# Patient Record
Sex: Female | Born: 1965 | Race: White | Hispanic: No | Marital: Married | Smoking: Current every day smoker
Health system: Southern US, Community
[De-identification: ages and names within clinical notes are randomized; demographics above are authoritative.]

## PROBLEM LIST (undated history)

## (undated) DIAGNOSIS — F1010 Alcohol abuse, uncomplicated: Secondary | ICD-10-CM

## (undated) DIAGNOSIS — I10 Essential (primary) hypertension: Secondary | ICD-10-CM

## (undated) DIAGNOSIS — F419 Anxiety disorder, unspecified: Secondary | ICD-10-CM

## (undated) DIAGNOSIS — Z720 Tobacco use: Secondary | ICD-10-CM

## (undated) HISTORY — PX: APPENDECTOMY: SHX54

---

## 1998-06-02 ENCOUNTER — Emergency Department (HOSPITAL_COMMUNITY): Admission: EM | Admit: 1998-06-02 | Discharge: 1998-06-02 | Payer: Self-pay | Admitting: Emergency Medicine

## 1999-01-28 ENCOUNTER — Emergency Department (HOSPITAL_COMMUNITY): Admission: EM | Admit: 1999-01-28 | Discharge: 1999-01-29 | Payer: Self-pay | Admitting: Emergency Medicine

## 1999-07-05 ENCOUNTER — Emergency Department (HOSPITAL_COMMUNITY): Admission: EM | Admit: 1999-07-05 | Discharge: 1999-07-05 | Payer: Self-pay

## 2000-09-05 ENCOUNTER — Encounter: Admission: RE | Admit: 2000-09-05 | Discharge: 2000-09-05 | Payer: Self-pay | Admitting: Obstetrics and Gynecology

## 2000-09-05 ENCOUNTER — Encounter: Payer: Self-pay | Admitting: Obstetrics and Gynecology

## 2001-03-25 ENCOUNTER — Emergency Department (HOSPITAL_COMMUNITY): Admission: EM | Admit: 2001-03-25 | Discharge: 2001-03-26 | Payer: Self-pay

## 2001-04-06 ENCOUNTER — Other Ambulatory Visit: Admission: RE | Admit: 2001-04-06 | Discharge: 2001-04-06 | Payer: Self-pay | Admitting: Obstetrics and Gynecology

## 2002-07-09 ENCOUNTER — Encounter: Payer: Self-pay | Admitting: Emergency Medicine

## 2002-07-09 ENCOUNTER — Emergency Department (HOSPITAL_COMMUNITY): Admission: EM | Admit: 2002-07-09 | Discharge: 2002-07-09 | Payer: Self-pay | Admitting: Emergency Medicine

## 2004-01-16 ENCOUNTER — Other Ambulatory Visit: Admission: RE | Admit: 2004-01-16 | Discharge: 2004-01-16 | Payer: Self-pay | Admitting: Obstetrics and Gynecology

## 2004-12-16 ENCOUNTER — Emergency Department (HOSPITAL_COMMUNITY): Admission: EM | Admit: 2004-12-16 | Discharge: 2004-12-16 | Payer: Self-pay | Admitting: Emergency Medicine

## 2005-07-17 ENCOUNTER — Other Ambulatory Visit: Admission: RE | Admit: 2005-07-17 | Discharge: 2005-07-17 | Payer: Self-pay | Admitting: Obstetrics and Gynecology

## 2005-08-14 ENCOUNTER — Encounter (INDEPENDENT_AMBULATORY_CARE_PROVIDER_SITE_OTHER): Payer: Self-pay | Admitting: *Deleted

## 2005-08-14 ENCOUNTER — Inpatient Hospital Stay (HOSPITAL_COMMUNITY): Admission: EM | Admit: 2005-08-14 | Discharge: 2005-08-14 | Payer: Self-pay | Admitting: Family Medicine

## 2007-02-02 ENCOUNTER — Other Ambulatory Visit: Admission: RE | Admit: 2007-02-02 | Discharge: 2007-02-02 | Payer: Self-pay | Admitting: Obstetrics and Gynecology

## 2009-04-15 ENCOUNTER — Emergency Department (HOSPITAL_COMMUNITY): Admission: EM | Admit: 2009-04-15 | Discharge: 2009-04-15 | Payer: Self-pay | Admitting: Emergency Medicine

## 2010-07-30 LAB — POCT CARDIAC MARKERS
CKMB, poc: <1 ng/mL — ABNORMAL LOW (ref 1.0–8.0)
Myoglobin, poc: 46.3 ng/mL (ref 12–200)
Troponin i, poc: <0.05 ng/mL (ref 0.00–0.09)

## 2010-09-14 NOTE — Op Note (Signed)
[name], [name]                ACCOUNT NO.:  [id number]   MEDICAL RECORD NO.:  [id number]           PATIENT TYPE:   LOCATION:                                 FACILITY:   PHYSICIAN:  Sharlet Salina T. Hoxworth, M.D.  DATE OF BIRTH:   DATE OF PROCEDURE:  08/14/2005  DATE OF DISCHARGE:                                 OPERATIVE REPORT   PRE-AND-POSTOPERATIVE DIAGNOSIS:  Acute appendicitis.   PROCEDURE:  Laparoscopic appendectomy.   SURGEON:  Lorne Skeens. Hoxworth, M.D.   ANESTHESIA:  General.   BRIEF HISTORY:  [name] is a 45 year old female who presents with  periumbilical and right-sided abdominal pain.  She had workup including a CT  scan showing evidence of acute appendicitis.  I have recommended proceeding  with a laparoscopic and possible open appendectomy.  The nature of the  procedure, indications, risks of bleeding, infection, and possible need for  open procedure were discussed and understood.  She is now brought to the  operating room for this procedure.   DESCRIPTION OF PROCEDURE:  The patient was brought to the operating room and  placed in the supine position on the operating table and general  endotracheal anesthesia was induced.  She had received broad spectrum  preoperative antibiotics.  The abdomen was widely sterilely prepped and  draped.  Local anesthesia was used to infiltrate the trocar sites.   A 1-cm incision was made at the umbilicus; and dissection was carried down  to the midline fascia which was sharply incised for 1 cm; and the peritoneum  entered under direct vision.  Through a mattress suture of #0 Vicryl the  Hasson trocar was placed and pneumoperitoneum established.  Under direct  vision a 5-mm trocar was placed in the right upper quadrant; and a 12-mm  trocar in the left lower quadrant.  The appendix was exposed and was acutely  inflamed without perforation or gangrene, lying just inferior to the cecum.  The appendix was elevated.  Peritoneal  attachments were divided laterally,  mobilizing the appendix into the cecum.  The mesoappendix was then  sequentially divided with the harmonic scalpel, completely freeing the  appendix down to its base which was not inflamed.  This was then divided  with a single firing of the Endo GIA 45-mm blue load stapler.  The operative  site was thoroughly irrigated, and inspected for hemostasis which appeared  complete.   The appendix was placed in the EndoCatch bag and brought out through the  umbilicus.  Trocars were removed under direct vision.  All CO2 evacuated.  The mattress suture was secured at the umbilical incision.  Skin incisions  were closed with interrupted subcuticular 5-0 Vicryl and Steri-Strips.  Sponge, needle, and instrument counts were correct.  The patient taken  recovery in good condition.      Lorne Skeens. Hoxworth, M.D.  Electronically Signed     BTH/MEDQ  D:  08/14/2005  T:  08/14/2005  Job:  [id number]

## 2010-09-14 NOTE — H&P (Signed)
NAMEBREYON, BLASS NO.:  [id number]   MEDICAL RECORD NO.:  [id number]           PATIENT TYPE:   LOCATION:                                 FACILITY:   PHYSICIAN:  Sharlet Salina T. Hoxworth, M.D.  DATE OF BIRTH:   DATE OF ADMISSION:  08/14/2005  DATE OF DISCHARGE:                                HISTORY & PHYSICAL   CHIEF COMPLAINT:  Abdominal pain.   HISTORY OF PRESENT ILLNESS:  Megan [name] is a generally healthy 45 year old  female who presents with approximately 12 hours of persistent abdominal  pain.  She describes a sharp, somewhat intermittent periumbilical pain that  is gradually worsening.  She states she has had some mild or similar pain on  a couple of occasions over the last few months that resolved.  She denies  any nausea, vomiting, fever or chills.  No GU symptoms.   PAST MEDICAL HISTORY:  She has been treated for gastritis.  No surgery.  No  other serious illness or hospitalizations.   MEDICATIONS:  1.  Prilosec daily.  2.  BC powders occasionally.   ALLERGIES:  She states that NARCOTICS and SULFA DRUGS make her nauseated.   SOCIAL HISTORY:  Positive for cigarettes and alcohol.   FAMILY HISTORY:  Noncontributory.   REVIEW OF SYSTEMS:  GENERAL:  No fever, chills, weight change.  RESPIRATORY:  No shortness of breath or cough, history of pulmonary disease.  CARDIAC:  No  chest pain, palpitations, history of heart disease.  ABDOMEN:  GI as above.  GU:  As above.   PHYSICAL EXAMINATION:  VITAL SIGNS:  Temperature is 97.9, pulse 77,  respirations 18, blood pressure 157/107.  GENERAL:  A mildly overweight white female in no acute distress.  SKIN:  Warm and dry.  No rash or infection.  HEENT:  No palpable mass or hepatomegaly.  Sclerae nonicteric.  Oropharynx  clear.  LYMPH NODES:  No cervical, supraclavicular or inguinal nodes palpable.  LUNGS:  Clear without wheezing or increased work of breathing.  CARDIAC:  Regular rate and rhythm.  No  murmurs.  No edema.  ABDOMEN:  Decreased bowel sounds.  There is well localized right mid  abdominal and right lower quadrant tenderness with guarding.  No palpable  masses or hepatosplenomegaly.  EXTREMITIES:  No joint swelling or deformity.  NEUROLOGIC:  Alert and oriented.  Sensory exam is grossly normal.   LABORATORY DATA:  Urinalysis negative.  Pregnancy test negative.  White  count is elevated at 1700, hemoglobin 14.1.  Electrolytes normal.   I reviewed her CT scan, which shows evidence of acute appendicitis without  perforation or abscess.   ASSESSMENT AND PLAN:  Apparent acute appendicitis.  The patient is being  treated preoperatively with broad spectrum antibiotics, and will be taken to  the operating room for laparoscopic appendectomy.      Lorne Skeens. Hoxworth, M.D.  Electronically Signed     BTH/MEDQ  D:  08/14/2005  T:  08/14/2005  Job:  [id number]

## 2014-10-20 ENCOUNTER — Encounter (HOSPITAL_COMMUNITY): Payer: Self-pay | Admitting: Emergency Medicine

## 2014-10-20 ENCOUNTER — Emergency Department (HOSPITAL_COMMUNITY)
Admission: EM | Admit: 2014-10-20 | Discharge: 2014-10-20 | Disposition: A | Discharge status: Home / Self Care | Payer: Self-pay | Attending: Emergency Medicine | Admitting: Emergency Medicine

## 2014-10-20 DIAGNOSIS — R030 Elevated blood-pressure reading, without diagnosis of hypertension: Secondary | ICD-10-CM

## 2014-10-20 DIAGNOSIS — Z880 Allergy status to penicillin: Secondary | ICD-10-CM | POA: Insufficient documentation

## 2014-10-20 DIAGNOSIS — I10 Essential (primary) hypertension: Secondary | ICD-10-CM | POA: Insufficient documentation

## 2014-10-20 DIAGNOSIS — Z720 Tobacco use: Secondary | ICD-10-CM | POA: Insufficient documentation

## 2014-10-20 DIAGNOSIS — Z79899 Other long term (current) drug therapy: Secondary | ICD-10-CM | POA: Insufficient documentation

## 2014-10-20 DIAGNOSIS — IMO0001 Reserved for inherently not codable concepts without codable children: Secondary | ICD-10-CM

## 2014-10-20 DIAGNOSIS — Z760 Encounter for issue of repeat prescription: Secondary | ICD-10-CM

## 2014-10-20 HISTORY — DX: Essential (primary) hypertension: I10

## 2014-10-20 MED ORDER — AMLODIPINE BESYLATE 5 MG PO TABS: 5.0000 mg | ORAL_TABLET | Freq: Every day | ORAL | Status: DC

## 2014-10-20 NOTE — ED Provider Notes (Signed)
CSN: [id number]     Arrival date & time 10/20/14  1739 History   This chart was scribed for Megan Glad, PA-C working with Pricilla Loveless, MD by Elveria Rising, ED Scribe. This patient was seen in room WTR7/WTR7 and the patient's care was started at 6:26 PM.   Chief Complaint  Patient presents with  . Medication Refill   The history is provided by the patient. No language interpreter was used.   HPI Comments: Megan [name] is a 49 y.o. female with PMHx of Hypertension who presents to the Emergency Department requesting medication refill of her Amlodipine  which she exhausted 3 weeks ago. Patient shares that she attempted to update her prescription through her PCP, but their office is now under [facility] and they will not see her without $200 deposit/completing new patient protocols. Patient states that today she began experiencing mild headache, which has resolved at this time.  Patient states that her HA was consistent with previous hypertensive episodes. Patient denies chest pain, vision changes, focal weakness, numbness, tingling, shortness of breath, or urinary symptoms.    Past Medical History  Diagnosis Date  . Hypertension    Past Surgical History  Procedure Laterality Date  . Appendectomy     History reviewed. No pertinent family history. History  Substance Use Topics  . Smoking status: Current Every Day Smoker  . Smokeless tobacco: Not on file  . Alcohol Use: No   OB History    No data available     Review of Systems  Respiratory: Negative for shortness of breath.   Cardiovascular: Negative for chest pain.  Neurological: Positive for headaches.   Allergies  Amoxicillin; Macrobid; and Sulfa antibiotics  Home Medications   Prior to Admission medications   Medication Sig Start Date End Date Taking? Authorizing Provider  amLODipine (NORVASC) 5 MG tablet Take 5 mg by mouth daily.   Yes Historical Provider, MD  Aspirin-Salicylamide-Caffeine (BC HEADACHE POWDER  PO) Take 1 packet by mouth daily as needed (headache).   Yes Historical Provider, MD  omeprazole (PRILOSEC) 20 MG capsule Take 20 mg by mouth daily.   Yes Historical Provider, MD   Triage Vitals: BP 182/103 mmHg  Pulse 82  Temp(Src) 98.4 F (36.9 C) (Oral)  Resp 18  SpO2 98% Physical Exam  Constitutional: She is oriented to person, place, and time. She appears well-developed and well-nourished. No distress.  HENT:  Head: Normocephalic and atraumatic.  Eyes: EOM are normal. Pupils are equal, round, and reactive to light.  Neck: Normal range of motion. Neck supple. No tracheal deviation present.  Cardiovascular: Normal rate, regular rhythm, normal heart sounds and intact distal pulses.   Pulmonary/Chest: Effort normal and breath sounds normal. No respiratory distress.  Musculoskeletal: Normal range of motion.  Neurological: She is alert and oriented to person, place, and time. She has normal strength. No cranial nerve deficit or sensory deficit. Gait normal.  Skin: Skin is warm and dry.  Psychiatric: She has a normal mood and affect. Her behavior is normal.  Nursing note and vitals reviewed.   ED Course  Procedures (including critical care time)  COORDINATION OF CARE: 6:32 PM- Discussed treatment plan with patient at bedside and patient agreed to plan.   Labs Review Labs Reviewed - No data to display  Imaging Review No results found.   EKG Interpretation None      MDM   Final diagnoses:  None   Patient presents today requesting medication refill of her Amlodipine that she has  been out of for 3 weeks.  Patient is asymptomatic this time.  No signs or symptoms of Hypertensive Emergency.  Normal neurological exam.  Patient given refill of her Amlodipine and instructed to follow up with PCP.     Megan Glad, PA-C 10/20/14 2006  Pricilla Loveless, MD 10/25/14 [phone]

## 2014-10-20 NOTE — ED Notes (Signed)
Pt states that she needs refill of amlodipine.  States she tried to get it from her PCP.  States that her doctor is switching over to [employer] and won't see her without a 200 dollar deposit. Pt states that she knows her BP is elevated.  Pt states that she has been without it x 2-3 wks.

## 2014-10-20 NOTE — ED Notes (Signed)
Ambulatory with steady gait. AVS explained in detail. Educated about stopping smoking immediately. Advised to follow DASH diet and have BP re-checked.

## 2015-05-14 ENCOUNTER — Encounter (HOSPITAL_COMMUNITY): Payer: Self-pay | Admitting: Emergency Medicine

## 2015-05-14 ENCOUNTER — Emergency Department (HOSPITAL_COMMUNITY)
Admission: EM | Admit: 2015-05-14 | Discharge: 2015-05-14 | Disposition: A | Discharge status: Home / Self Care | Payer: Self-pay | Attending: Emergency Medicine | Admitting: Emergency Medicine

## 2015-05-14 DIAGNOSIS — Z79899 Other long term (current) drug therapy: Secondary | ICD-10-CM | POA: Insufficient documentation

## 2015-05-14 DIAGNOSIS — M10072 Idiopathic gout, left ankle and foot: Secondary | ICD-10-CM | POA: Insufficient documentation

## 2015-05-14 DIAGNOSIS — M109 Gout, unspecified: Secondary | ICD-10-CM

## 2015-05-14 DIAGNOSIS — I10 Essential (primary) hypertension: Secondary | ICD-10-CM | POA: Insufficient documentation

## 2015-05-14 DIAGNOSIS — F17200 Nicotine dependence, unspecified, uncomplicated: Secondary | ICD-10-CM | POA: Insufficient documentation

## 2015-05-14 DIAGNOSIS — Z880 Allergy status to penicillin: Secondary | ICD-10-CM | POA: Insufficient documentation

## 2015-05-14 DIAGNOSIS — M79672 Pain in left foot: Secondary | ICD-10-CM | POA: Insufficient documentation

## 2015-05-14 LAB — I-STAT CHEM 8, ED
BUN: 5 mg/dL — AB (ref 6–20)
CREATININE: 0.6 mg/dL (ref 0.44–1.00)
Calcium, Ion: 1.1 mmol/L — ABNORMAL LOW (ref 1.12–1.23)
Chloride: 101 mmol/L (ref 101–111)
GLUCOSE: 116 mg/dL — AB (ref 65–99)
HEMATOCRIT: 46 % (ref 36.0–46.0)
Hemoglobin: 15.6 g/dL — ABNORMAL HIGH (ref 12.0–15.0)
POTASSIUM: 3.4 mmol/L — AB (ref 3.5–5.1)
Sodium: 140 mmol/L (ref 135–145)
TCO2: 27 mmol/L (ref 0–100)

## 2015-05-14 MED ORDER — AMLODIPINE BESYLATE 5 MG PO TABS: 5.0000 mg | ORAL_TABLET | Freq: Every day | ORAL | Status: DC

## 2015-05-14 MED ORDER — INDOMETHACIN 25 MG PO CAPS: 25.0000 mg | ORAL_CAPSULE | Freq: Three times a day (TID) | ORAL | Status: DC | PRN

## 2015-05-14 MED ORDER — HYDROCODONE-ACETAMINOPHEN 5-325 MG PO TABS
1.0000 | ORAL_TABLET | Freq: Once | ORAL | Status: AC
  Administered 2015-05-14: 1 via ORAL
  Filled 2015-05-14: qty 1

## 2015-05-14 MED ORDER — AMLODIPINE BESYLATE 5 MG PO TABS
5.0000 mg | ORAL_TABLET | Freq: Once | ORAL | Status: AC
  Administered 2015-05-14: 5 mg via ORAL
  Filled 2015-05-14: qty 1

## 2015-05-14 MED ORDER — HYDROCODONE-ACETAMINOPHEN 5-325 MG PO TABS: 1.0000 | ORAL_TABLET | ORAL | Status: DC | PRN

## 2015-05-14 NOTE — Discharge Instructions (Signed)
Read the information below.  Use the prescribed medication as directed.  Please discuss all new medications with your pharmacist.  Do not take additional tylenol while taking the prescribed pain medication to avoid overdose.  You may return to the Emergency Department at any time for worsening condition or any new symptoms that concern you.    If you develop fevers, uncontrolled pain, weakness or numbness of the extremity, increased redness or swelling of the foot or leg, or you are unable to walk, return to the ER for a recheck.       Hypertension Hypertension is another name for high blood pressure. High blood pressure forces your heart to work harder to pump blood. A blood pressure reading has two numbers, which includes a higher number over a lower number (example: 110/72). HOME CARE   Have your blood pressure rechecked by your doctor.  Only take medicine as told by your doctor. Follow the directions carefully. The medicine does not work as well if you skip doses. Skipping doses also puts you at risk for problems.  Do not smoke.  Monitor your blood pressure at home as told by your doctor. GET HELP IF:  You think you are having a reaction to the medicine you are taking.  You have repeat headaches or feel dizzy.  You have puffiness (swelling) in your ankles.  You have trouble with your vision. GET HELP RIGHT AWAY IF:   You get a very bad headache and are confused.  You feel weak, numb, or faint.  You get chest or belly (abdominal) pain.  You throw up (vomit).  You cannot breathe very well. MAKE SURE YOU:   Understand these instructions.  Will watch your condition.  Will get help right away if you are not doing well or get worse.   This information is not intended to replace advice given to you by your health care provider. Make sure you discuss any questions you have with your health care provider.   Document Released: 10/02/2007 Document Revised: 04/20/2013 Document  Reviewed: 02/05/2013 Elsevier Interactive Patient Education 2016 Elsevier Inc.  Managing Your High Blood Pressure Blood pressure is a measurement of how forceful your blood is pressing against the walls of the arteries. Arteries are muscular tubes within the circulatory system. Blood pressure does not stay the same. Blood pressure rises when you are active, excited, or nervous; and it lowers during sleep and relaxation. If the numbers measuring your blood pressure stay above normal most of the time, you are at risk for health problems. High blood pressure (hypertension) is a long-term (chronic) condition in which blood pressure is elevated. A blood pressure reading is recorded as two numbers, such as 120 over 80 (or 120/80). The first, higher number is called the systolic pressure. It is a measure of the pressure in your arteries as the heart beats. The second, lower number is called the diastolic pressure. It is a measure of the pressure in your arteries as the heart relaxes between beats.  Keeping your blood pressure in a normal range is important to your overall health and prevention of health problems, such as heart disease and stroke. When your blood pressure is uncontrolled, your heart has to work harder than normal. High blood pressure is a very common condition in adults because blood pressure tends to rise with age. Men and women are equally likely to have hypertension but at different times in life. Before age 49, men are more likely to have hypertension. After 49  years of age, women are more likely to have it. Hypertension is especially common in African Americans. This condition often has no signs or symptoms. The cause of the condition is usually not known. Your caregiver can help you come up with a plan to keep your blood pressure in a normal, healthy range. BLOOD PRESSURE STAGES Blood pressure is classified into four stages: normal, prehypertension, stage 1, and stage 2. Your blood pressure  reading will be used to determine what type of treatment, if any, is necessary. Appropriate treatment options are tied to these four stages:  Normal  Systolic pressure (mm Hg): below 120.  Diastolic pressure (mm Hg): below 80. Prehypertension  Systolic pressure (mm Hg): 120 to 139.  Diastolic pressure (mm Hg): 80 to 89. Stage1  Systolic pressure (mm Hg): 140 to 159.  Diastolic pressure (mm Hg): 90 to 99. Stage2  Systolic pressure (mm Hg): 160 or above.  Diastolic pressure (mm Hg): 100 or above. RISKS RELATED TO HIGH BLOOD PRESSURE Managing your blood pressure is an important responsibility. Uncontrolled high blood pressure can lead to:  A heart attack.  A stroke.  A weakened blood vessel (aneurysm).  Heart failure.  Kidney damage.  Eye damage.  Metabolic syndrome.  Memory and concentration problems. HOW TO MANAGE YOUR BLOOD PRESSURE Blood pressure can be managed effectively with lifestyle changes and medicines (if needed). Your caregiver will help you come up with a plan to bring your blood pressure within a normal range. Your plan should include the following: Education  Read all information provided by your caregivers about how to control blood pressure.  Educate yourself on the latest guidelines and treatment recommendations. New research is always being done to further define the risks and treatments for high blood pressure. Lifestylechanges  Control your weight.  Avoid smoking.  Stay physically active.  Reduce the amount of salt in your diet.  Reduce stress.  Control any chronic conditions, such as high cholesterol or diabetes.  Reduce your alcohol intake. Medicines  Several medicines (antihypertensive medicines) are available, if needed, to bring blood pressure within a normal range. Communication  Review all the medicines you take with your caregiver because there may be side effects or interactions.  Talk with your caregiver about your  diet, exercise habits, and other lifestyle factors that may be contributing to high blood pressure.  See your caregiver regularly. Your caregiver can help you create and adjust your plan for managing high blood pressure. RECOMMENDATIONS FOR TREATMENT AND FOLLOW-UP  The following recommendations are based on current guidelines for managing high blood pressure in nonpregnant adults. Use these recommendations to identify the proper follow-up period or treatment option based on your blood pressure reading. You can discuss these options with your caregiver.  Systolic pressure of 120 to 139 or diastolic pressure of 80 to 89: Follow up with your caregiver as directed.  Systolic pressure of 140 to 160 or diastolic pressure of 90 to 100: Follow up with your caregiver within 2 months.  Systolic pressure above 160 or diastolic pressure above 100: Follow up with your caregiver within 1 month.  Systolic pressure above 180 or diastolic pressure above 110: Consider antihypertensive therapy; follow up with your caregiver within 1 week.  Systolic pressure above 200 or diastolic pressure above 120: Begin antihypertensive therapy; follow up with your caregiver within 1 week.   This information is not intended to replace advice given to you by your health care provider. Make sure you discuss any questions you have with  your health care provider.   Document Released: 01/08/2012 Document Reviewed: 01/08/2012 Elsevier Interactive Patient Education [employer]    Emergency Department Resource Guide 1) Find a Doctor and Pay Out of Pocket Although you won't have to find out who is covered by your insurance plan, it is a good idea to ask around and get recommendations. You will then need to call the office and see if the doctor you have chosen will accept you as a new patient and what types of options they offer for patients who are self-pay. Some doctors offer discounts or will set up payment plans for their  patients who do not have insurance, but you will need to ask so you aren't surprised when you get to your appointment.  2) Contact Your Local Health Department Not all health departments have doctors that can see patients for sick visits, but many do, so it is worth a call to see if yours does. If you don't know where your local health department is, you can check in your phone book. The CDC also has a tool to help you locate your state's health department, and many state websites also have listings of all of their local health departments.  3) Find a Walk-in Clinic If your illness is not likely to be very severe or complicated, you may want to try a walk in clinic. These are popping up all over the country in pharmacies, drugstores, and shopping centers. They're usually staffed by nurse practitioners or physician assistants that have been trained to treat common illnesses and complaints. They're usually fairly quick and inexpensive. However, if you have serious medical issues or chronic medical problems, these are probably not your best option.  No Primary Care Doctor: - Call Health Connect at  [phone] - they can help you locate a primary care doctor that  accepts your insurance, provides certain services, etc. - Physician Referral Service- [phone]  Chronic Pain Problems: Organization         Address  Phone   Notes  [facility]  [phone] Patients need to be referred by their primary care doctor.   Medication Assistance: Organization         Address  Phone   Notes  [facility] Medication [facility] [address] [place] [place], [place] [address] [phone] --Must be a resident of [facility] -- Must have NO insurance coverage whatsoever (no Medicaid/ Medicare, etc.) -- The pt. MUST have a primary care doctor that directs their care regularly and follows them in the community   MedAssist  [phone]   [employer]  [phone]     Agencies that provide inexpensive medical care: Organization         Address  Phone   Notes  [facility]  [phone]   [facility]    [phone]   [facility] [address] [place], [place] [address] [phone]   [facility] [address] [place] [place] [phone]   Planned Parenthood    [phone]   [facility]    [phone]   [facility]  [address], [place] Phone:  [phone], Fax:  [phone] Hours of Operation:  9 am - 6 pm, M-F.  Also accepts Medicaid/Medicare and self-pay.  [facility]  [address], [place] Phone: [phone], Fax: [phone]. Hours of Operation:  8:30 am - 5:30 pm, M-F.  Also accepts Medicaid and self-pay.  [facility] [place] [address] [place] Phone: [phone]   [facility] [address] Natasha Bence [place], [place] [phone], Ext. [phone] Mondays & Thursdays: 7-9 AM.  First 15 patients are seen on a first come, first serve basis.    Medicaid-accepting [facility] Providers:  Organization         Address  Phone   Notes  [facility] [address], [place] [phone] Also accepts self-pay patients.  [facility] [address] Laurell Josephs [place], [place]  [phone]   [facility] [address], [place] [phone]   [facility] [address] [place] [phone]   Renaye Rakers [address], [place]   [phone] Only accepts [place] Access [place] patients after they have their name applied to their card.   Self-Pay (no insurance) in [facility]:  Organization         Address  Phone   Notes  Sickle Cell Patients, [facility] [address] [place], [place] [phone]    [facility] [address] [place], [place] [phone]   [facility]  [address], [place] [phone]   [facility]/Dr. Osei-Bonsu  [address] [place] or [address], [place] [phone] Phone number for both [place] and [place] locations is the same.  [facility] [address], [place] [phone]   [facility] [address] [place] or [address] [phone]   [facility] [address] [place] [phone], phone; [phone], fax Sees patients 1st and 3rd Saturday of every month.  Must not qualify for public or private insurance (i.e. Medicaid, Medicare, [place] Health Choice, Veterans' Benefits)  Household income should be no more than 200% of the poverty level The clinic cannot treat you if you are pregnant or think you are pregnant  Sexually transmitted diseases are not treated at the clinic.    Dental Care: Organization         Address  Phone  Notes  [facility] Department of [facility] [address] [place], [place] [phone] Accepts children up to age 66 who are enrolled in [place] or [place] Health Choice; pregnant women with a Medicaid card; and children who have applied for Medicaid or [place] Health Choice, but were declined, whose parents can pay a reduced fee at time of service.  [facility] Department of [facility]  [address], [place] [phone] Accepts children up to age 86 who are enrolled in [place] or [place] Health Choice; pregnant women with a Medicaid card; and children who have applied for Medicaid or [place] Health Choice, but were declined, whose parents can pay a reduced fee at time of service.  [facility]  [address] [place], [place] [phone] Patients are seen by  appointment only. Walk-ins are not accepted. [facility] will see patients 59 years of age and older. Monday - Tuesday (8am-5pm) Most Wednesdays (8:30-5pm) $30 per visit, cash only  [facility] [employer] [facility]  [address], [employer] [phone])  [phone] Patients are seen by appointment only. Walk-ins are not accepted. [facility] will see patients 68 years of age and older. One Wednesday Evening (Monthly: Volunteer Based).  $30 per visit, cash only  [employer]  [phone] for adults; Children under age 59, call Graduate Pediatric Dentistry at [phone]. Children aged 89-14, please call [phone] to request a pediatric application.  Dental services are provided in all areas of dental care including fillings, crowns and bridges, complete and partial dentures, implants, gum treatment, root canals, and extractions. Preventive care is also provided. Treatment is provided to both adults and children. Patients are selected via a lottery and there is often a waiting list.   [facility] [address], [place]  [phone] [email or web address]   [facility] [address] [place], [place] [phone], Ext. [phone] Second and Fourth Thursday of each month, opens at 6:30 AM; Clinic ends at 9 AM.  Patients are seen on a first-come first-served basis, and a limited number are seen during each clinic.   [facility]  [address] Ether Griffins [place], [place] [phone]   Eligibility Requirements You must have lived in [place], [place], or [place] counties for at least the last three months.   You cannot be eligible for state or federal sponsored [place], including [employer], [place], or [employer].   You generally cannot be eligible for healthcare insurance through your employer.    How to apply: Eligibility screenings are held every Tuesday and Wednesday afternoon from 1:00 pm until 4:00 pm. You  do not need an appointment for the interview!  [facility] [address] [place], [place] [phone]   [facility] Health Department  [phone]   [facility] Health Department  [phone]   [facility] Health Department  [phone]    Behavioral Health Resources in the Community: Intensive Outpatient Programs Organization         Address  Phone  Notes  [facility] [address] [place], [place] [phone]   [facility] Outpatient [address] [place], [place] [phone]   [facility] [address] [place], [place]  [phone]   [facility] [address],  [place], [place] [phone] or [phone]   Substance Abuse Resources Organization         Address  Phone  Notes  [facility]  [phone]   [facility]  [phone]   [facility]  [phone]   Floydene Flock  [phone]   Residential & Outpatient Substance Abuse Program  [phone]   Psychological Services Organization         Address  Phone  Notes  [facility] Behavioral Health  [phone]   [facility] Services  [phone]   [facility] [address], [place] [phone] or [phone]    Mobile Crisis Teams Organization         Address  Phone  Notes  [facility], Mobile Crisis Care Unit  [phone]   [facility]  [address] [place], [place] [phone]   Doristine Locks [address] [place] [phone]    Self-Help/Support Groups Organization         Address  Phone             Notes  [facility] - variety of support groups  336- I7437963  Call for more information  Narcotics Anonymous (NA), [facility] [address], [employer] [place]  2 meetings at this location   Residential Sports administrator          Address  Phone  Notes  [facility] [address],    [place]  [phone]   [facility]  [address] [place] [address], [place], [place] [phone]   [facility] [address], [place] [phone] Admissions: 8am-3pm M-F  [facility] [address]    [place], [place] [phone]   [facility] [address] [place], [place], [place] [phone]   [facility] [address],  [place], [place] [phone]   [facility] - Intensive Outpatient [address]urell Joseph[address], [place], [place] [phone]   [facility] ([facility] [address] [place]  [place], [place] [phone] or [phone]   [facility] ([facility]) [address] [place], [place] [phone] Accepts Medicaid  [facility] [address],  [place] [phone] Substance Abuse/Addiction Treatment   [facility] Organization         Address  Phone  Notes  [facility]  [phone]   Angie Fava, PhD [address] Ervin Knack [place], [place]   [phone] or [phone]   [facility]   [address] [place], [place] [phone]   [facility] [address] [place], [place] [phone] Insurance/Medicaid/sponsorship through [facility] [address]                                    [place], [place] [phone] Therapy/tele-psych/case  [facility] [address], [place] [phone]    Dr. Lolly Mustache  [phone]   [facility]  [employer] [facility] Dept. 1) [address] [place] 2) [address], [place] 3)  [address], [place] [phone]  [phone]   [facility] Child Abuse Hotline [phone])[phone] or 4588559507 (After Hours)

## 2015-05-14 NOTE — ED Provider Notes (Signed)
History  By signing my name below, I, Karle PlumberJennifer Tensley, attest that this documentation has been prepared under the direction and in the presence of Niketa Turner, PA-C. Electronically Signed: Karle PlumberJennifer Tensley, ED Scribe. 05/14/2015. 5:42 PM.  Chief Complaint  Patient presents with  . Hypertension  . Foot Pain   The history is provided by the patient and medical records. No language interpreter was used.    HPI Comments:  Megan [name] is a 50 y.o. female who presents to the Emergency Department complaining of severe throbbing pain to her left great toe that started worsening yesterday. Pt rates the pain at 9/10. She reports having similar pain intermittently for the past several years but never this bad and assumed it was a bunion. She reports associated redness and warmth that began yesterday as well. Touching the area increases the pain. She denies alleviating factors. She denies chills, body aches, nausea, vomiting or diarrhea. She denies any trauma, injury or fall.  She states she has a wart that has been present for a very long time, on top of the area in which she has applied vinegar about 2 weeks ago (and intermittently) with no significant relief of the symptoms - this wart is unchanged.   She also complains of hypertension since being out of her Amlodipine for the past 7 months. She states she has been taking some she gets from a friend intermittently. There are no modifying factors reported. She denies SOB, HA, dizziness, nausea, light-headedness, CP or weakness.  Past Medical History  Diagnosis Date  . Hypertension    Past Surgical History  Procedure Laterality Date  . Appendectomy     No family history on file. Social History  Substance Use Topics  . Smoking status: Current Every Day Smoker  . Smokeless tobacco: None  . Alcohol Use: No   OB History    No data available     Review of Systems  Constitutional: Negative for fever and chills.  Respiratory: Negative for  shortness of breath.   Cardiovascular: Negative for chest pain.  Gastrointestinal: Negative for nausea, vomiting and abdominal pain.  Musculoskeletal: Positive for arthralgias. Negative for myalgias.  Skin: Positive for color change.  Allergic/Immunologic: Negative for immunocompromised state.  Neurological: Negative for dizziness, weakness, light-headedness and headaches.  Hematological: Does not bruise/bleed easily.  Psychiatric/Behavioral: Negative for self-injury.    Allergies  Amoxicillin; Macrobid; and Sulfa antibiotics  Home Medications   Prior to Admission medications   Medication Sig Start Date End Date Taking? Authorizing Provider  amLODipine (NORVASC) 5 MG tablet Take 1 tablet (5 mg total) by mouth daily. 05/14/15   Trixie DredgeEmily Dallie Patton, PA-C  Aspirin-Salicylamide-Caffeine (BC HEADACHE POWDER PO) Take 1 packet by mouth daily as needed (headache).    Historical Provider, MD  omeprazole (PRILOSEC) 20 MG capsule Take 20 mg by mouth daily.    Historical Provider, MD   Triage Vitals: BP 170/118 mmHg  Pulse 104  Temp(Src) 98.1 F (36.7 C) (Oral)  Resp 18  SpO2 100% Physical Exam  Constitutional: She appears well-developed and well-nourished. No distress.  HENT:  Head: Normocephalic and atraumatic.  Neck: Neck supple.  Cardiovascular: Normal rate and regular rhythm.   Left pedal pulses intact. Cap refill of left foot less than 3 seconds.  Pulmonary/Chest: Effort normal and breath sounds normal. No respiratory distress. She has no wheezes. She has no rales.  Abdominal: Soft. She exhibits no distension. There is no tenderness. There is no rebound and no guarding.  Musculoskeletal:  Left foot  first MTP with erythema, warmth and tenderness. Decreased ROM. No fluctuance. No induration. No calf edema or tenderness. No red streaking. Small, rough firm lesion c/w wart over dorsal foot near 1st MTP.    Neurological: She is alert.  Skin: She is not diaphoretic.  Nursing note and vitals  reviewed.   ED Course  Procedures (including critical care time) DIAGNOSTIC STUDIES: Oxygen Saturation is 100% on RA, normal by my interpretation.   COORDINATION OF CARE: 5:29 PM- Will prescribe Indomethacin. Return precautions discussed. Will refill Amlodipine after labs. Will order Vicodin and Amlodipine prior to discharge. Offered crutches but pt declined. Pt verbalizes understanding and agrees to plan.  Medications - No data to display  Labs Review Labs Reviewed  I-STAT CHEM 8, ED - Abnormal; Notable for the following:    Potassium 3.4 (*)    BUN 5 (*)    Glucose, Bld 116 (*)    Calcium, Ion 1.10 (*)    Hemoglobin 15.6 (*)    All other components within normal limits     MDM   Final diagnoses:  Essential hypertension  Acute gout of left foot, unspecified cause    Afebrile, nontoxic patient with left 1st MTP inflammation c/w gout.  She had no injury, there are no openings in the skin or no systemic symptoms that make me suspicious for septic joint. Pt given pain medication in ED as well as dose of her antihypertensive.   D/C home with PCP resources for follow up (advised very close follow up), norco, indomethacin, amlodipine.  Discussed result, findings, treatment, and follow up  with patient.  Pt given return precautions.  Pt verbalizes understanding and agrees with plan.        I personally performed the services described in this documentation, which was scribed in my presence. The recorded information has been reviewed and is accurate.     Trixie Dredge, PA-C 05/14/15 1911  Linwood Dibbles, MD 05/16/15 1000

## 2015-05-14 NOTE — ED Notes (Signed)
Pt given d/c instructions and Rx. Pt verbalized understanding.

## 2015-05-14 NOTE — ED Notes (Signed)
Pt states that she has been having redness and swelling to proximal joint of great toe since Friday.  States she thought it was her bunion causing her pain but it has since progressively become more red and swollen.  Pt also hypertensive and is requesting refill of amlodipine.

## 2016-05-19 ENCOUNTER — Encounter (HOSPITAL_COMMUNITY): Payer: Self-pay | Admitting: Emergency Medicine

## 2016-05-19 ENCOUNTER — Emergency Department (HOSPITAL_COMMUNITY)
Admission: EM | Admit: 2016-05-19 | Discharge: 2016-05-19 | Disposition: A | Discharge status: Home / Self Care | Payer: Self-pay | Attending: Emergency Medicine | Admitting: Emergency Medicine

## 2016-05-19 ENCOUNTER — Emergency Department (HOSPITAL_COMMUNITY): Payer: Self-pay

## 2016-05-19 DIAGNOSIS — I10 Essential (primary) hypertension: Secondary | ICD-10-CM | POA: Insufficient documentation

## 2016-05-19 DIAGNOSIS — Y939 Activity, unspecified: Secondary | ICD-10-CM | POA: Insufficient documentation

## 2016-05-19 DIAGNOSIS — F17200 Nicotine dependence, unspecified, uncomplicated: Secondary | ICD-10-CM | POA: Insufficient documentation

## 2016-05-19 DIAGNOSIS — S93402A Sprain of unspecified ligament of left ankle, initial encounter: Secondary | ICD-10-CM | POA: Insufficient documentation

## 2016-05-19 DIAGNOSIS — Y999 Unspecified external cause status: Secondary | ICD-10-CM | POA: Insufficient documentation

## 2016-05-19 DIAGNOSIS — M79672 Pain in left foot: Secondary | ICD-10-CM | POA: Insufficient documentation

## 2016-05-19 DIAGNOSIS — Y929 Unspecified place or not applicable: Secondary | ICD-10-CM | POA: Insufficient documentation

## 2016-05-19 DIAGNOSIS — X58XXXA Exposure to other specified factors, initial encounter: Secondary | ICD-10-CM | POA: Insufficient documentation

## 2016-05-19 DIAGNOSIS — Z7982 Long term (current) use of aspirin: Secondary | ICD-10-CM | POA: Insufficient documentation

## 2016-05-19 DIAGNOSIS — Z79899 Other long term (current) drug therapy: Secondary | ICD-10-CM | POA: Insufficient documentation

## 2016-05-19 MED ORDER — OXYCODONE-ACETAMINOPHEN 5-325 MG PO TABS: 1.0000 | ORAL_TABLET | ORAL | 0 refills | Status: DC | PRN

## 2016-05-19 MED ORDER — OXYCODONE-ACETAMINOPHEN 5-325 MG PO TABS
1.0000 | ORAL_TABLET | Freq: Once | ORAL | Status: AC
  Administered 2016-05-19: 1 via ORAL
  Filled 2016-05-19: qty 1

## 2016-05-19 NOTE — ED Notes (Signed)
3" ACE wrap applied to left foot/ankle. She is in no distress.

## 2016-05-19 NOTE — Discharge Instructions (Signed)
Please take her pain medicine as needed for ear pain. Please follow-up with your regular doctor for further management of your symptoms. If her symptoms do not improve, you may need to follow-up with a orthopedic physician. Please use the RICE care plan as discussed for rest, ice, compression, and elevation. If symptoms worsen or look like infection, please return to the nearest emergency department.

## 2016-05-19 NOTE — ED Triage Notes (Signed)
Patient states that when she woke up yesterday morning her left foot and ankle had pain with weight bearing.  Patient states this morning she was unable to bear no weight and had to crawl. Patient denies any injury to cause pain.

## 2016-05-19 NOTE — ED Provider Notes (Signed)
[facility]-EMERGENCY DEPT Provider Note   CSN: [id number] Arrival date & time: 05/19/16  0841     History   Chief Complaint Chief Complaint  Patient presents with  . Ankle Pain  . Foot Pain    HPI Megan [name] is a 51 y.o. female with a past medical history significant for hypertension, sleepwalking, and gout who presents with left foot pain. Patient says that since yesterday morning, she has been having pain in the middle of her left foot on both the plantar and dorsal surfaces. She denies specific injury but says she does recall he sleepwalking and may have injured it during an episode. Patient says that she had gout last year on her right great toe and says that this does not feel similar. Patient denies any other preceding symptoms such as fevers, chills, chest pain, shortness of breath, nausea, vomiting. She denies any rashes. She denies any redness of the area or any pain in the hip, knee, other parts of the body. She reports extreme pain with palpation on the area as well as movement of the ankle. He describes the pain as 10 out of 10 severity and inability to bear weight. She denies any skin injuries recently.     HPI  Past Medical History:  Diagnosis Date  . Hypertension     There are no active problems to display for this patient.   Past Surgical History:  Procedure Laterality Date  . APPENDECTOMY      OB History    No data available       Home Medications    Prior to Admission medications   Medication Sig Start Date End Date Taking? Authorizing Provider  amLODipine (NORVASC) 5 MG tablet Take 1 tablet (5 mg total) by mouth daily. 05/14/15   Trixie Dredge, PA-C  Aspirin-Salicylamide-Caffeine (BC HEADACHE POWDER PO) Take 1 packet by mouth daily as needed (headache).    Historical Provider, MD  HYDROcodone-acetaminophen (NORCO/VICODIN) 5-325 MG tablet Take 1-2 tablets by mouth every 4 (four) hours as needed for moderate pain or severe pain. 05/14/15   Trixie Dredge, PA-C    indomethacin (INDOCIN) 25 MG capsule Take 1-2 capsules (25-50 mg total) by mouth 3 (three) times daily as needed for mild pain or moderate pain. 05/14/15   Trixie Dredge, PA-C  omeprazole (PRILOSEC) 20 MG capsule Take 20 mg by mouth daily.    Historical Provider, MD    Family History No family history on file.  Social History Social History  Substance Use Topics  . Smoking status: Current Every Day Smoker  . Smokeless tobacco: Never Used  . Alcohol use Yes     Allergies   Amoxicillin; Macrobid [nitrofurantoin monohyd macro]; and Sulfa antibiotics   Review of Systems Review of Systems  Constitutional: Negative for activity change, chills, diaphoresis, fatigue and fever.  HENT: Negative for congestion and rhinorrhea.   Eyes: Negative for visual disturbance.  Respiratory: Negative for cough, chest tightness, shortness of breath and stridor.   Cardiovascular: Negative for chest pain, palpitations and leg swelling.  Gastrointestinal: Negative for abdominal distention, abdominal pain, constipation, diarrhea, nausea and vomiting.  Genitourinary: Negative for difficulty urinating, dysuria, flank pain, frequency, hematuria, menstrual problem, pelvic pain, vaginal bleeding and vaginal discharge.  Musculoskeletal: Negative for back pain and neck pain.  Skin: Negative for rash and wound.  Neurological: Negative for dizziness, weakness, light-headedness, numbness and headaches.  Psychiatric/Behavioral: Negative for agitation and confusion.  All other systems reviewed and are negative.    Physical Exam Updated  Vital Signs BP (!) 157/103 (BP Location: Left Arm)   Pulse 106   Temp 97.7 F (36.5 C) (Oral)   Resp 18   Ht 5\' 4"  (1.626 m)   Wt 153 lb (69.4 kg)   SpO2 97%   BMI 26.26 kg/m   Physical Exam  Constitutional: She is oriented to person, place, and time. She appears well-developed and well-nourished. No distress.  HENT:  Head: Normocephalic and atraumatic.  Right Ear:  External ear normal.  Left Ear: External ear normal.  Nose: Nose normal.  Mouth/Throat: Oropharynx is clear and moist. No oropharyngeal exudate.  Eyes: Conjunctivae and EOM are normal. Pupils are equal, round, and reactive to light.  Neck: Normal range of motion. Neck supple.  Cardiovascular: Normal rate.   No murmur heard. Pulmonary/Chest: Effort normal. No stridor. No respiratory distress. She exhibits no tenderness.  Abdominal: She exhibits no distension. There is no tenderness. There is no rebound.  Musculoskeletal: She exhibits tenderness. She exhibits no deformity.       Left foot: There is tenderness and swelling. There is normal range of motion, normal capillary refill, no crepitus, no deformity and no laceration.       Feet:  Neurological: She is alert and oriented to person, place, and time. She has normal reflexes. She displays normal reflexes. No cranial nerve deficit or sensory deficit. She exhibits normal muscle tone. Coordination normal.  Skin: Skin is warm. No rash noted. She is not diaphoretic. No erythema.  Nursing note and vitals reviewed.    ED Treatments / Results  Labs (all labs ordered are listed, but only abnormal results are displayed) Labs Reviewed - No data to display  EKG  EKG Interpretation None       Radiology Dg Ankle Complete Left  Result Date: 05/19/2016 CLINICAL DATA:  Patient states that when she woke up yesterday morning her left foot and ankle had pain with weight bearing. Patient states this morning she was unable to bear no weight and had to crawl. Patient denies any injury to cause pain. EXAM: LEFT ANKLE COMPLETE - 3+ VIEW COMPARISON:  None. FINDINGS: There is no evidence of fracture, dislocation, or joint effusion. There is a small plantar calcaneal spur. There is no evidence of arthropathy or other focal bone abnormality. Soft tissues are unremarkable. IMPRESSION: No acute osseous injury of the left ankle. If there is further clinical  concern regarding occult injury, MRI may be helpful for increased sensitivity. Electronically Signed   By: Elige Ko   On: 05/19/2016 09:44   Dg Foot Complete Left  Result Date: 05/19/2016 CLINICAL DATA:  Patient states that when she woke up yesterday morning her left foot and ankle had pain with weight bearing. Patient states this morning she was unable to bear no weight and had to crawl. Patient denies any injury to cause pain. EXAM: LEFT FOOT - COMPLETE 3+ VIEW COMPARISON:  None. FINDINGS: There is no evidence of fracture or dislocation. There is a small plantar calcaneal spur. There is hallux valgus of the left foot. There is no evidence of arthropathy or other focal bone abnormality. Soft tissues are unremarkable. IMPRESSION: 1. No acute osseous injury of the left foot. If there is further clinical concern regarding occult injury, MRI is recommended for increased sensitivity. Electronically Signed   By: Elige Ko   On: 05/19/2016 09:42    Procedures Procedures (including critical care time)  Medications Ordered in ED Medications  oxyCODONE-acetaminophen (PERCOCET/ROXICET) 5-325 MG per tablet 1 tablet (1  tablet Oral Given 05/19/16 0941)     Initial Impression / Assessment and Plan / ED Course  I have reviewed the triage vital signs and the nursing notes.  Pertinent labs & imaging results that were available during my care of the patient were reviewed by me and considered in my medical decision making (see chart for details).     Don PerkingJody Eccleston is a 51 y.o. female with a past medical history significant for hypertension, sleepwalking, and gout who presents with left foot pain.  History and exam are seen above.  On exam, patient has a knot on the middle aspect of the dorsum of her left foot. This area is tender. Patient also tenderness across the bottom of her foot. Pain with movement of the ankle and palpation of the ankle. No erythema, edema, red streaking, or crepitance. No evidence  of skin injury. Normal pulses, sensation, and capillary refill. Patient can move her toes. No pain with knee or hip manipulation. Lungs clear and abdomen nontender.  Patient will have x-ray to evaluate for fracture. Suspect ankle sprain causing injury, potentially during sleepwalking episode. Given difference from prior gout episode and no involvement of great toe, doubt gout at this time. Also considering plantar fasciitis given the pain on the bottom of her foot however, as there is a small knot on the top of the foot, injury considered more likely. Doubt infection based on lack of erythema, crepitance, or other systemic signs of infection.   Patient will be given pain medication and reassessed after x-rays.  Patient reported significant pain improvement after Percocet. X-rays revealed no evidence of acute fracture or soft tissue abnormality.  Given improvement in symptoms, suspect likely sprain versus strain of the foot/ankle. Patient also reports extensive history of plantar fasciitis, feel this may be exacerbated by possible sprain. Patient instructed to observe rice therapy and take pain medicine as needed. Patient instructed to remain nonweightbearing and use crutches. Patient will follow-up with regular physician in next few days and consider orthopedic evaluation if her symptoms continue for the next week. Patient understood return precautions for signs and symptoms of infection. Patient had no other questions or concerns and patient was discharged in good condition.    Final Clinical Impressions(s) / ED Diagnoses   Final diagnoses:  Foot pain, left  Sprain of left ankle, unspecified ligament, initial encounter    New Prescriptions Discharge Medication List as of 05/19/2016 11:29 AM    START taking these medications   Details  oxyCODONE-acetaminophen (PERCOCET/ROXICET) 5-325 MG tablet Take 1 tablet by mouth every 4 (four) hours as needed for severe pain., Starting Sun 05/19/2016, Print        Clinical Impression: 1. Foot pain, left   2. Sprain of left ankle, unspecified ligament, initial encounter     Disposition: Discharge  Condition: Good  I have discussed the results, Dx and Tx plan with the pt(& family if present). He/she/they expressed understanding and agree(s) with the plan. Discharge instructions discussed at great length. Strict return precautions discussed and pt &/or family have verbalized understanding of the instructions. No further questions at time of discharge.    Discharge Medication List as of 05/19/2016 11:29 AM    START taking these medications   Details  oxyCODONE-acetaminophen (PERCOCET/ROXICET) 5-325 MG tablet Take 1 tablet by mouth every 4 (four) hours as needed for severe pain., Starting Sun 05/19/2016, Print        Follow Up: [facility] [address] [place]  [place] [address] [phone]    [facility] [address] [phone] [address] [place] [address] [phone]  If symptoms worsen     Heide Scales, MD 05/20/16 [phone]

## 2017-02-11 ENCOUNTER — Emergency Department (HOSPITAL_COMMUNITY): Payer: Self-pay

## 2017-02-11 ENCOUNTER — Encounter (HOSPITAL_COMMUNITY): Payer: Self-pay | Admitting: *Deleted

## 2017-02-11 ENCOUNTER — Inpatient Hospital Stay (HOSPITAL_COMMUNITY)
Admission: EM | Admit: 2017-02-11 | Discharge: 2017-02-18 | DRG: 433 | Disposition: A | Discharge status: Home / Self Care | Payer: Self-pay | Attending: Internal Medicine | Admitting: Internal Medicine

## 2017-02-11 DIAGNOSIS — K7011 Alcoholic hepatitis with ascites: Principal | ICD-10-CM | POA: Diagnosis present

## 2017-02-11 DIAGNOSIS — F419 Anxiety disorder, unspecified: Secondary | ICD-10-CM | POA: Diagnosis present

## 2017-02-11 DIAGNOSIS — D72829 Elevated white blood cell count, unspecified: Secondary | ICD-10-CM

## 2017-02-11 DIAGNOSIS — E871 Hypo-osmolality and hyponatremia: Secondary | ICD-10-CM | POA: Diagnosis not present

## 2017-02-11 DIAGNOSIS — Z881 Allergy status to other antibiotic agents status: Secondary | ICD-10-CM

## 2017-02-11 DIAGNOSIS — I10 Essential (primary) hypertension: Secondary | ICD-10-CM | POA: Diagnosis present

## 2017-02-11 DIAGNOSIS — E869 Volume depletion, unspecified: Secondary | ICD-10-CM | POA: Diagnosis present

## 2017-02-11 DIAGNOSIS — R52 Pain, unspecified: Secondary | ICD-10-CM

## 2017-02-11 DIAGNOSIS — R011 Cardiac murmur, unspecified: Secondary | ICD-10-CM | POA: Diagnosis present

## 2017-02-11 DIAGNOSIS — R718 Other abnormality of red blood cells: Secondary | ICD-10-CM | POA: Diagnosis present

## 2017-02-11 DIAGNOSIS — D539 Nutritional anemia, unspecified: Secondary | ICD-10-CM | POA: Diagnosis present

## 2017-02-11 DIAGNOSIS — Z882 Allergy status to sulfonamides status: Secondary | ICD-10-CM

## 2017-02-11 DIAGNOSIS — Z87892 Personal history of anaphylaxis: Secondary | ICD-10-CM

## 2017-02-11 DIAGNOSIS — K7010 Alcoholic hepatitis without ascites: Secondary | ICD-10-CM | POA: Diagnosis present

## 2017-02-11 DIAGNOSIS — Z720 Tobacco use: Secondary | ICD-10-CM

## 2017-02-11 DIAGNOSIS — R17 Unspecified jaundice: Secondary | ICD-10-CM

## 2017-02-11 DIAGNOSIS — Z6826 Body mass index (BMI) 26.0-26.9, adult: Secondary | ICD-10-CM

## 2017-02-11 DIAGNOSIS — E876 Hypokalemia: Secondary | ICD-10-CM | POA: Diagnosis present

## 2017-02-11 DIAGNOSIS — Z880 Allergy status to penicillin: Secondary | ICD-10-CM

## 2017-02-11 DIAGNOSIS — Z801 Family history of malignant neoplasm of trachea, bronchus and lung: Secondary | ICD-10-CM

## 2017-02-11 DIAGNOSIS — F1010 Alcohol abuse, uncomplicated: Secondary | ICD-10-CM | POA: Diagnosis present

## 2017-02-11 DIAGNOSIS — K5900 Constipation, unspecified: Secondary | ICD-10-CM | POA: Diagnosis not present

## 2017-02-11 DIAGNOSIS — E440 Moderate protein-calorie malnutrition: Secondary | ICD-10-CM | POA: Diagnosis present

## 2017-02-11 HISTORY — DX: Tobacco use: Z72.0

## 2017-02-11 HISTORY — DX: Alcohol abuse, uncomplicated: F10.10

## 2017-02-11 HISTORY — DX: Anxiety disorder, unspecified: F41.9

## 2017-02-11 LAB — URINALYSIS, ROUTINE W REFLEX MICROSCOPIC
Glucose, UA: 50 mg/dL — AB
Ketones, ur: NEGATIVE mg/dL
Leukocytes, UA: NEGATIVE
Nitrite: NEGATIVE
PH: 5 (ref 5.0–8.0)
Protein, ur: 30 mg/dL — AB
SPECIFIC GRAVITY, URINE: 1.021 (ref 1.005–1.030)
WBC, UA: NONE SEEN WBC/hpf (ref 0–5)

## 2017-02-11 LAB — COMPREHENSIVE METABOLIC PANEL
ALT: 51 U/L (ref 14–54)
AST: 194 U/L — ABNORMAL HIGH (ref 15–41)
Albumin: 2.6 g/dL — ABNORMAL LOW (ref 3.5–5.0)
Alkaline Phosphatase: 304 U/L — ABNORMAL HIGH (ref 38–126)
Anion gap: 14 (ref 5–15)
BUN: 5 mg/dL — AB (ref 6–20)
CALCIUM: 9.1 mg/dL (ref 8.9–10.3)
CO2: 22 mmol/L (ref 22–32)
Chloride: 95 mmol/L — ABNORMAL LOW (ref 101–111)
Glucose, Bld: 115 mg/dL — ABNORMAL HIGH (ref 65–99)
Potassium: 3.4 mmol/L — ABNORMAL LOW (ref 3.5–5.1)
SODIUM: 131 mmol/L — AB (ref 135–145)
Total Bilirubin: 26 mg/dL (ref 0.3–1.2)
Total Protein: 7.8 g/dL (ref 6.5–8.1)

## 2017-02-11 LAB — CBC
HCT: 36.7 % (ref 36.0–46.0)
Hemoglobin: 13.2 g/dL (ref 12.0–15.0)
MCH: 41.5 pg — AB (ref 26.0–34.0)
MCHC: 36 g/dL (ref 30.0–36.0)
MCV: 115.4 fL — ABNORMAL HIGH (ref 78.0–100.0)
PLATELETS: 290 10*3/uL (ref 150–400)
RBC: 3.18 MIL/uL — AB (ref 3.87–5.11)
RDW: 17 % — AB (ref 11.5–15.5)
WBC: 23.8 10*3/uL — ABNORMAL HIGH (ref 4.0–10.5)

## 2017-02-11 LAB — MAGNESIUM: MAGNESIUM: 1.7 mg/dL (ref 1.7–2.4)

## 2017-02-11 LAB — DIFFERENTIAL
BASOS PCT: 0 %
Basophils Absolute: 0 10*3/uL (ref 0.0–0.1)
Eosinophils Absolute: 0.1 10*3/uL (ref 0.0–0.7)
Eosinophils Relative: 0 %
LYMPHS PCT: 9 %
Lymphs Abs: 2.2 10*3/uL (ref 0.7–4.0)
MONOS PCT: 7 %
Monocytes Absolute: 1.8 10*3/uL — ABNORMAL HIGH (ref 0.1–1.0)
NEUTROS ABS: 21.5 10*3/uL — AB (ref 1.7–7.7)
Neutrophils Relative %: 84 %

## 2017-02-11 LAB — AMMONIA: AMMONIA: 35 umol/L (ref 9–35)

## 2017-02-11 LAB — BILIRUBIN, DIRECT: Bilirubin, Direct: 15.6 mg/dL — ABNORMAL HIGH (ref 0.1–0.5)

## 2017-02-11 LAB — PROTIME-INR
INR: 1.59
Prothrombin Time: 18.8 seconds — ABNORMAL HIGH (ref 11.4–15.2)

## 2017-02-11 LAB — PHOSPHORUS: Phosphorus: 3.3 mg/dL (ref 2.5–4.6)

## 2017-02-11 LAB — LIPASE, BLOOD: Lipase: 59 U/L — ABNORMAL HIGH (ref 11–51)

## 2017-02-11 MED ORDER — ONDANSETRON HCL 4 MG PO TABS: 4.0000 mg | ORAL_TABLET | Freq: Four times a day (QID) | ORAL | Status: DC | PRN

## 2017-02-11 MED ORDER — LORAZEPAM 1 MG PO TABS: 1.0000 mg | ORAL_TABLET | Freq: Four times a day (QID) | ORAL | Status: AC | PRN

## 2017-02-11 MED ORDER — LORAZEPAM 1 MG PO TABS
0.0000 mg | ORAL_TABLET | Freq: Four times a day (QID) | ORAL | Status: AC
  Administered 2017-02-12: 2 mg via ORAL
  Filled 2017-02-11 (×2): qty 2

## 2017-02-11 MED ORDER — ADULT MULTIVITAMIN W/MINERALS CH
1.0000 | ORAL_TABLET | Freq: Every day | ORAL | Status: DC
  Administered 2017-02-15: 1 via ORAL
  Filled 2017-02-11 (×6): qty 1

## 2017-02-11 MED ORDER — METHYLPREDNISOLONE SODIUM SUCC 40 MG IJ SOLR
40.0000 mg | Freq: Once | INTRAMUSCULAR | Status: AC
  Administered 2017-02-11: 40 mg via INTRAVENOUS
  Filled 2017-02-11: qty 1

## 2017-02-11 MED ORDER — THIAMINE HCL 100 MG/ML IJ SOLN
100.0000 mg | Freq: Every day | INTRAMUSCULAR | Status: DC
  Filled 2017-02-11: qty 2

## 2017-02-11 MED ORDER — IOPAMIDOL (ISOVUE-300) INJECTION 61%
INTRAVENOUS | Status: AC
  Administered 2017-02-11: 100 mL via INTRAVENOUS
  Filled 2017-02-11: qty 100

## 2017-02-11 MED ORDER — POTASSIUM CHLORIDE IN NACL 20-0.9 MEQ/L-% IV SOLN
INTRAVENOUS | Status: AC
  Administered 2017-02-11 – 2017-02-13 (×3): via INTRAVENOUS
  Filled 2017-02-11 (×3): qty 1000

## 2017-02-11 MED ORDER — VITAMIN B-1 100 MG PO TABS
100.0000 mg | ORAL_TABLET | Freq: Every day | ORAL | Status: DC
  Administered 2017-02-12 – 2017-02-18 (×7): 100 mg via ORAL
  Filled 2017-02-11 (×7): qty 1

## 2017-02-11 MED ORDER — MAGNESIUM SULFATE 2 GM/50ML IV SOLN
2.0000 g | Freq: Once | INTRAVENOUS | Status: AC
  Administered 2017-02-11: 2 g via INTRAVENOUS
  Filled 2017-02-11: qty 50

## 2017-02-11 MED ORDER — LORAZEPAM 2 MG/ML IJ SOLN: 1.0000 mg | Freq: Four times a day (QID) | INTRAMUSCULAR | Status: AC | PRN

## 2017-02-11 MED ORDER — FOLIC ACID 1 MG PO TABS
1.0000 mg | ORAL_TABLET | Freq: Every day | ORAL | Status: DC
  Administered 2017-02-12 – 2017-02-18 (×7): 1 mg via ORAL
  Filled 2017-02-11 (×7): qty 1

## 2017-02-11 MED ORDER — ONDANSETRON HCL 4 MG/2ML IJ SOLN: 4.0000 mg | Freq: Four times a day (QID) | INTRAMUSCULAR | Status: DC | PRN

## 2017-02-11 MED ORDER — BOOST / RESOURCE BREEZE PO LIQD
1.0000 | Freq: Three times a day (TID) | ORAL | Status: DC
  Administered 2017-02-12 (×2): 1 via ORAL

## 2017-02-11 MED ORDER — PENTOXIFYLLINE ER 400 MG PO TBCR
400.0000 mg | EXTENDED_RELEASE_TABLET | Freq: Three times a day (TID) | ORAL | Status: DC
  Administered 2017-02-11 – 2017-02-18 (×21): 400 mg via ORAL
  Filled 2017-02-11 (×23): qty 1

## 2017-02-11 MED ORDER — SODIUM CHLORIDE 0.9 % IV BOLUS (SEPSIS)
1000.0000 mL | Freq: Once | INTRAVENOUS | Status: AC
Start: 2017-02-11 — End: 2017-02-11
  Administered 2017-02-11: 1000 mL via INTRAVENOUS

## 2017-02-11 MED ORDER — LORAZEPAM 1 MG PO TABS: 0.0000 mg | ORAL_TABLET | Freq: Two times a day (BID) | ORAL | Status: AC

## 2017-02-11 NOTE — ED Provider Notes (Signed)
[facility]-EMERGENCY DEPT Provider Note   CSN: [id number] Arrival date & time: 02/11/17  1440     History   Chief Complaint Chief Complaint  Patient presents with  . Abdominal Pain  . Jaundice    HPI Megan [name] is a 51 y.o. female.  HPI Patient presents to the emergency department with nausea, vomiting, abdominal distention  Discolored urine and jaundice.  The patient, states she has never had any troubles or jaundice in the past.  She states she does history daily.  Patient states nothing seems make the condition better or worse.  She states she has not able tolerate much oral intake.  She does drink three quarters of Gatorade per day. Patient states that she did not take any medications prior to arrivalThe patient denies chest pain, shortness of breath, headache,blurred vision, neck pain, fever, cough, weakness, numbness, dizziness, anorexia, edema, abdominal pain,rash, back pain, dysuria, hematemesis, bloody stool, near syncope, or syncope. Past Medical History:  Diagnosis Date  . Hypertension     There are no active problems to display for this patient.   Past Surgical History:  Procedure Laterality Date  . APPENDECTOMY      OB History    No data available       Home Medications    Prior to Admission medications   Medication Sig Start Date End Date Taking? Authorizing Provider  Cinnamon 500 MG capsule Take 500 mg by mouth daily.   Yes [provider]  magnesium oxide (MAG-OX) 400 MG tablet Take 250 mg by mouth daily.   Yes [provider]  omeprazole (PRILOSEC) 20 MG capsule Take 20 mg by mouth daily.   Yes [provider]  Pyridoxine HCl (VITAMIN B-6) 250 MG tablet Take 250 mg by mouth daily.   Yes [provider]  amLODipine (NORVASC) 5 MG tablet Take 1 tablet (5 mg total) by mouth daily. Patient not taking: Reported on 02/11/2017 05/14/15   Trixie Dredge, PA-C  HYDROcodone-acetaminophen (NORCO/VICODIN)  5-325 MG tablet Take 1-2 tablets by mouth every 4 (four) hours as needed for moderate pain or severe pain. Patient not taking: Reported on 02/11/2017 05/14/15   Trixie Dredge, PA-C  indomethacin (INDOCIN) 25 MG capsule Take 1-2 capsules (25-50 mg total) by mouth 3 (three) times daily as needed for mild pain or moderate pain. Patient not taking: Reported on 02/11/2017 05/14/15   Trixie Dredge, PA-C  oxyCODONE-acetaminophen (PERCOCET/ROXICET) 5-325 MG tablet Take 1 tablet by mouth every 4 (four) hours as needed for severe pain. Patient not taking: Reported on 02/11/2017 05/19/16   Tegeler, Canary Brim, MD    Family History No family history on file.  Social History Social History  Substance Use Topics  . Smoking status: Current Every Day Smoker  . Smokeless tobacco: Never Used  . Alcohol use Yes     Allergies   Amoxicillin; Macrobid [nitrofurantoin monohyd macro]; and Sulfa antibiotics   Review of Systems Review of Systems All other systems negative except as documented in the HPI. All pertinent positives and negatives as reviewed in the HPI.  Physical Exam Updated Vital Signs BP 116/75 (BP Location: Left Arm)   Pulse 98   Temp 98.3 F (36.8 C) (Oral)   Resp 20   Ht  (1.651 m)   Wt 65.8 kg (145 lb)   SpO2 98%   BMI 24.13 kg/m   Physical Exam  Constitutional: She is oriented to person, place, and time. She appears well-developed and well-nourished. No distress.  HENT:  Head: Normocephalic and atraumatic.  Mouth/Throat: Oropharynx is clear and moist.  Eyes: Pupils are equal, round, and reactive to light.  Neck: Normal range of motion. Neck supple.  Cardiovascular: Normal rate, regular rhythm and normal heart sounds.  Exam reveals no gallop and no friction rub.   No murmur heard. Pulmonary/Chest: Effort normal and breath sounds normal. No respiratory distress. She has no wheezes.  Abdominal: Soft. Bowel sounds are normal. She exhibits distension. She exhibits no mass.  There is no tenderness. There is no rebound and no guarding.  Neurological: She is alert and oriented to person, place, and time. She exhibits normal muscle tone. Coordination normal.  Skin: Skin is warm and dry. Capillary refill takes less than 2 seconds. No rash noted. No erythema.  Psychiatric: She has a normal mood and affect. Her behavior is normal.  Nursing note and vitals reviewed.    ED Treatments / Results  Labs (all labs ordered are listed, but only abnormal results are displayed) Labs Reviewed  LIPASE, BLOOD - Abnormal; Notable for the following:       Result Value   Lipase 59 (*)    All other components within normal limits  COMPREHENSIVE METABOLIC PANEL - Abnormal; Notable for the following:    Sodium 131 (*)    Potassium 3.4 (*)    Chloride 95 (*)    Glucose, Bld 115 (*)    BUN 5 (*)    Creatinine, Ser <0.30 (*)    Albumin 2.6 (*)    AST 194 (*)    Alkaline Phosphatase 304 (*)    Total Bilirubin 26.0 (*)    All other components within normal limits  CBC - Abnormal; Notable for the following:    WBC 23.8 (*)    RBC 3.18 (*)    MCV 115.4 (*)    MCH 41.5 (*)    RDW 17.0 (*)    All other components within normal limits  URINALYSIS, ROUTINE W REFLEX MICROSCOPIC - Abnormal; Notable for the following:    Color, Urine AMBER (*)    APPearance CLOUDY (*)    Glucose, UA 50 (*)    Hgb urine dipstick SMALL (*)    Bilirubin Urine MODERATE (*)    Protein, ur 30 (*)    Bacteria, UA MANY (*)    Squamous Epithelial / LPF 0-5 (*)    Non Squamous Epithelial 0-5 (*)    All other components within normal limits  PROTIME-INR - Abnormal; Notable for the following:    Prothrombin Time 18.8 (*)    All other components within normal limits    EKG  EKG Interpretation None       Radiology Ct Abdomen Pelvis W Contrast  Result Date: 02/11/2017 CLINICAL DATA:  Nausea, jaundice, abdominal swelling. Orange urine. Elevated bilirubin EXAM: CT ABDOMEN AND PELVIS WITH CONTRAST  TECHNIQUE: Multidetector CT imaging of the abdomen and pelvis was performed using the standard protocol following bolus administration of intravenous contrast. CONTRAST:  ISOVUE-300 IOPAMIDOL (ISOVUE-300) INJECTION 61% COMPARISON:  CT 08/14/2005 FINDINGS: Lower chest: Lung bases are clear. Hepatobiliary: Moderately enlarged. There is ill-defined hypoattenuation within the LEFT and RIGHT hepatic lobe but more prominent in the RIGHT hepatic lobe. No focal lesion is present. No biliary duct dilatation. The gallbladder is normal. Common bile duct is normal caliber. Pancreas: Pancreas normal. No pancreatic duct dilatation or inflammation. Spleen: Normal spleen Adrenals/urinary tract: Adrenal glands and kidneys are normal. The ureters and bladder normal. Stomach/Bowel: Stomach duodenum normal. There  is a duodenum diverticulum extending along the anti mesenteric border of the second portion the duodenum (2.7 cm image 44, series 2). Colon and rectosigmoid colon normal. Post appendectomy. There multiple diverticula scattered throughout the colon without acute inflammation. There is moderate volume intraperitoneal free fluid along the margin liver and within the leaves of the small bowel and colon mesenteric. Vascular/Lymphatic: Abdominal aorta is normal caliber with atherosclerotic calcification. There is no retroperitoneal or periportal lymphadenopathy. No pelvic lymphadenopathy. Reproductive: Uterus and ovaries normal Other: Moderate free fluid in the abdomen pelvis is simple fluid attenuation Musculoskeletal: No aggressive osseous lesion. IMPRESSION: . 1. Liver is moderately enlarged and has diffuse hypoattenuation. 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 Infiltrative tumor could have a similar pattern. 2. Moderate volume of intraperitoneal free fluid presumed related to hepatic dysfunction. 3. Normal pancreas. 4.  Incidental finding of duodenum diverticulum and colon diverticulosis. Electronically Signed   By: Genevive Bi M.D.   On: 02/11/2017 19:14    Procedures Procedures (including critical care time)  Medications Ordered in ED Medications  sodium chloride 0.9 % bolus 1,000 mL (1,000 mLs Intravenous New Bag/Given 02/11/17 1849)  iopamidol (ISOVUE-300) 61 % injection (100 mLs Intravenous Contrast Given 02/11/17 1834)     Initial Impression / Assessment and Plan / ED Course  I have reviewed the triage vital signs and the nursing notes.  Pertinent labs & imaging results that were available during my care of the patient were reviewed by me and considered in my medical decision making (see chart for details).    Patient will be admitted to the hospital spoke with the [facility] and also spoke with, the GI specialist about the patient.  They will see her tomorrow in consultation. [facility] was on-call.    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

## 2017-02-11 NOTE — H&P (Addendum)
History and Physical    [name]:[id number] DOB: 1965/07/25 DOA: 02/11/2017  PCP: Patient, No Pcp Per   Patient coming from: Home.  I have personally briefly reviewed patient's old medical records in [facility]  Chief Complaint: abdominal distention, nausea and jaundice.  HPI: Megan [name] is a 51 y.o. female with medical history significant of alcohol abuse, anxiety, hypertension, tobacco use disorder who is coming to the emergency department with complaints of not feeling well for the past 10 days, when she first began having nausea, decreased appetite, followed by jaundice, dark color urine, decreased urinary volume and abdominal distention for the past 2 days. She mentions that she has been drinking several shots of whiskey every night for the past 10 years. She denies drinking in the past 6 days, withdrawal symptoms or previous detox admission. She denies hepatitis history or exposure. She denies HIV exposure to her knowledge, and declined screening tests. No fever, but positive chills and fatigue. She denies headaches, earaches, sore throat, productive cough with dyspnea. However, she mentions having a recent nosebleed several days ago and bruising easily. No chest pain, dizziness, diaphoresis, orthopnea, PND or pitting edema of the lower extremities. Denies dysuria, frequency or hematuria, but states that her urinary volume has been less than usual. No polyuria, blurred vision, polyphagia or polydipsia.  ED Course: initial vital signs temperature 99.55F, pulse 103, respirations 16, blood pressure 133/82 mmHg and O2 sat 98% on room air. She received 1 L normal saline bolus in the emergency department.  Her workup shows a urine analysis showing mild glucosuria, small hemoglobinuria, moderate bilirubin and mild proteinuria. Microscopic shows many bacteria. WBC was 23.8, hemoglobin 13.2 g/dL and platelets 811. PT 18.8 seconds and INR 1.59. Sodium 131, potassium 3.4, chloride 95 and  bicarbonate 22 mmol/L. BUN was 5, creatinine less than 0.30 and glucose 115 mg/dL.her albumin was 2.6 g/dL, total bilirubin 26 mg/dL, AST 914 and alkaline phosphatase 304 units.  CT abdomen/pelvis with contrast:  1. Liver is moderately enlarged and has diffuse hypoattenuation. 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 Infiltrative tumor could have a similar pattern. 2. Moderate volume of intraperitoneal free fluid presumed related to hepatic dysfunction. 3. Normal pancreas. 4. Incidental finding of duodenum diverticulum and colon Diverticulosis.  Please see images and full radiology report for further detail.   Review of Systems: As per HPI otherwise 10 point review of systems negative.   Past Medical History:  Diagnosis Date  . [name] Alcohol abuse Anxiety   . [name] Hypertension Tobacco use     Past Surgical History:  Procedure Laterality Date  . APPENDECTOMY       reports that she has been smoking.  She has never used smokeless tobacco. She reports that she drinks alcohol. She reports that she does not use drugs.  Allergies  Allergen Reactions  . Amoxicillin     anaphylaxis  . Macrobid [Nitrofurantoin Monohyd Macro]     anaphylaxis  . Sulfa Antibiotics     anaphylaxis    Family History  Problem Relation Age of Onset  . Lung cancer Mother   . COPD Mother        Emphysema  . Lung cancer Father   . Cystic fibrosis Sister   . Atrial fibrillation Sister     Prior to Admission medications   Medication Sig Start Date End Date Taking? Authorizing Provider  Cinnamon 500 MG capsule Take 500 mg by  mouth daily.   Yes [provider]  magnesium oxide (MAG-OX) 400 MG tablet Take 250 mg by mouth daily.   Yes [provider]  omeprazole (PRILOSEC) 20 MG capsule Take 20 mg by mouth daily.   Yes [provider]  Pyridoxine HCl (VITAMIN B-6) 250 MG tablet Take 250  mg by mouth daily.   Yes [provider]  amLODipine (NORVASC) 5 MG tablet Take 1 tablet (5 mg total) by mouth daily. Patient not taking: Reported on 02/11/2017 05/14/15   Trixie Dredge, PA-C  HYDROcodone-acetaminophen (NORCO/VICODIN) 5-325 MG tablet Take 1-2 tablets by mouth every 4 (four) hours as needed for moderate pain or severe pain. Patient not taking: Reported on 02/11/2017 05/14/15   Trixie Dredge, PA-C  indomethacin (INDOCIN) 25 MG capsule Take 1-2 capsules (25-50 mg total) by mouth 3 (three) times daily as needed for mild pain or moderate pain. Patient not taking: Reported on 02/11/2017 05/14/15   Trixie Dredge, PA-C  oxyCODONE-acetaminophen (PERCOCET/ROXICET) 5-325 MG tablet Take 1 tablet by mouth every 4 (four) hours as needed for severe pain. Patient not taking: Reported on 02/11/2017 05/19/16   Tegeler, Canary Brim, MD    Physical Exam: Vitals:   02/11/17 1737 02/11/17 1800 02/11/17 1900 02/11/17 2143  BP: 121/80 110/83 116/75 110/74  Pulse: (!) 104 (!) 102 98 (!) 105  Resp: 18  20   Temp: 98.3 F (36.8 C)   98.4 F (36.9 C)  TempSrc: Oral   Oral  SpO2: 99%  98% 99%  Weight:    71.4 kg (157 lb 8 oz)  Height:     (1.651 m)    Constitutional: NAD, calm, comfortable Eyes: PERRL, positive icteric sclerae, lids and conjunctivae normal ENMT: Mucous membranes are dry. Lips are peeled. Posterior pharynx clear of any exudate or lesions. Neck: normal, supple, no masses, no thyromegaly Respiratory: clear to auscultation bilaterally, no wheezing, no crackles. Normal respiratory effort. No accessory muscle use.  Cardiovascular: Regular rate and rhythm, positive systolic murmur louder on aortic valve examination. No rubs / gallops. No extremity edema. 2+ pedal pulses. No carotid bruits.  Abdomen: distended, ascites, caput meduasae, mild diffuse tenderness, no guarding/rebound/masses palpated. No hepatosplenomegaly. Bowel sounds positive.  Musculoskeletal: no clubbing /  cyanosis. Good ROM, no contractures. Normal muscle tone.  Skin: jaundiced. Ecchymosis on extremities. Neurologic: no tremors or tongue fasciculations. CN 2-12 grossly intact. Sensation intact, DTR normal. Strength 5/5 in all 4.  Psychiatric: Normal judgment and insight. Alert and oriented x 4. Mildly anxious mood.    Labs on Admission: I have personally reviewed following labs and imaging studies  CBC:  Recent Labs Lab 02/11/17 1550  WBC 23.8*  NEUTROABS 21.5*  HGB 13.2  HCT 36.7  MCV 115.4*  PLT 290   Basic Metabolic Panel:  Recent Labs Lab 02/11/17 1550  NA 131*  K 3.4*  CL 95*  CO2 22  GLUCOSE 115*  BUN 5*  CREATININE <0.30*  CALCIUM 9.1  MG 1.7  PHOS 3.3   GFR: CrCl cannot be calculated (This lab value cannot be used to calculate CrCl because it is not a number: <0.30). Liver Function Tests:  Recent Labs Lab 02/11/17 1550  AST 194*  ALT 51  ALKPHOS 304*  BILITOT 26.0*  PROT 7.8  ALBUMIN 2.6*    Recent Labs Lab 02/11/17 1550  LIPASE 59*    Recent Labs Lab 02/11/17 2016  AMMONIA 35   Coagulation Profile:  Recent Labs Lab 02/11/17 1842  INR 1.59  Cardiac Enzymes: No results for input(s): CKTOTAL, CKMB, CKMBINDEX, TROPONINI in the last 168 hours. BNP (last 3 results) No results for input(s): PROBNP in the last 8760 hours. HbA1C: No results for input(s): HGBA1C in the last 72 hours. CBG: No results for input(s): GLUCAP in the last 168 hours. Lipid Profile: No results for input(s): CHOL, HDL, LDLCALC, TRIG, CHOLHDL, LDLDIRECT in the last 72 hours. Thyroid Function Tests: No results for input(s): TSH, T4TOTAL, FREET4, T3FREE, THYROIDAB in the last 72 hours. Anemia Panel: No results for input(s): VITAMINB12, FOLATE, FERRITIN, TIBC, IRON, RETICCTPCT in the last 72 hours. Urine analysis:    Component Value Date/Time   COLORURINE AMBER (A) 02/11/2017 1556   APPEARANCEUR CLOUDY (A) 02/11/2017 1556   LABSPEC 1.021 02/11/2017 1556    PHURINE 5.0 02/11/2017 1556   GLUCOSEU 50 (A) 02/11/2017 1556   HGBUR SMALL (A) 02/11/2017 1556   BILIRUBINUR MODERATE (A) 02/11/2017 1556   KETONESUR NEGATIVE 02/11/2017 1556   PROTEINUR 30 (A) 02/11/2017 1556   NITRITE NEGATIVE 02/11/2017 1556   LEUKOCYTESUR NEGATIVE 02/11/2017 1556    Radiological Exams on Admission: Ct Abdomen Pelvis W Contrast  Result Date: 02/11/2017 CLINICAL DATA:  Nausea, jaundice, abdominal swelling. Orange urine. Elevated bilirubin EXAM: CT ABDOMEN AND PELVIS WITH CONTRAST TECHNIQUE: Multidetector CT imaging of the abdomen and pelvis was performed using the standard protocol following bolus administration of intravenous contrast. CONTRAST:  ISOVUE-300 IOPAMIDOL (ISOVUE-300) INJECTION 61% COMPARISON:  CT 08/14/2005 FINDINGS: Lower chest: Lung bases are clear. Hepatobiliary: Moderately enlarged. There is ill-defined hypoattenuation within the LEFT and RIGHT hepatic lobe but more prominent in the RIGHT hepatic lobe. No focal lesion is present. No biliary duct dilatation. The gallbladder is normal. Common bile duct is normal caliber. Pancreas: Pancreas normal. No pancreatic duct dilatation or inflammation. Spleen: Normal spleen Adrenals/urinary tract: Adrenal glands and kidneys are normal. The ureters and bladder normal. Stomach/Bowel: Stomach duodenum normal. There is a duodenum diverticulum extending along the anti mesenteric border of the second portion the duodenum (2.7 cm image 44, series 2). Colon and rectosigmoid colon normal. Post appendectomy. There multiple diverticula scattered throughout the colon without acute inflammation. There is moderate volume intraperitoneal free fluid along the margin liver and within the leaves of the small bowel and colon mesenteric. Vascular/Lymphatic: Abdominal aorta is normal caliber with atherosclerotic calcification. There is no retroperitoneal or periportal lymphadenopathy. No pelvic lymphadenopathy. Reproductive: Uterus and  ovaries normal Other: Moderate free fluid in the abdomen pelvis is simple fluid attenuation Musculoskeletal: No aggressive osseous lesion. IMPRESSION: . 1. Liver is moderately enlarged and has diffuse hypoattenuation. 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 Infiltrative tumor could have a similar pattern. 2. Moderate volume of intraperitoneal free fluid presumed related to hepatic dysfunction. 3. Normal pancreas. 4. Incidental finding of duodenum diverticulum and colon diverticulosis. Electronically Signed   By: Genevive Bi M.D.   On: 02/11/2017 19:14    EKG: Independently reviewed.  Assessment/Plan Principal problem:   Acute alcoholic hepatitis MELD score of 27 points with a 19.6% mortality rate at 3 months. Admit to telemetry/inpatient. Clear liquid diet. Consult dietitian to assess nutritional status/requirements. The patient is determined to discontinue alcohol use for the rest of her life. Continue IV fluids. Single dose Solu-Medrol 40 mg IVP, pending hepatitis panel results. Trental 400 mg by mouth 3 times a day. Protonix 40 mg by mouth daily. Start CiWA protocol.   Active Problems:   Leukocytosis No fever,  no night sweats, but complains of chills. I suspect hemoconcentration due to volume depletion. I will continue IV hydration and follow-up WBC in a.m. (Solu-Medrol 40 mg IVP 1 ordered) Obtain blood cultures 2 and start IV antibiotics if the patient develops a fever and/or any other signs of active infection.    Elevated MCV I suspect that the patient is anemic, volume depleted and hemo-concentrated. Continue IV hydration. Check folic acid and B12 level in the morning.    Anxiety Lorazepam as needed.    Hypertension Currently not using antihypertensives. Monitor blood pressure.    Hypokalemia Replacing. Magnesium was supplemented. Monitor potassium level.    Heart  murmur Previously unknown to the patient. Most likely, not clinically significant. Will however obtain echocardiogram in the morning.    Tobacco use Declined nicotine replacement therapy. Tobacco cessation information to be provided before discharge.   DVT prophylaxis: SCDs. Code Status: full code. Family Communication:  Disposition Plan: admit to telemetry for GI evaluation,further workup and treatment.  Consults called: GI was consulted by the emergency department. Admission status: inpatient/telemetry.   Bobette Mo MD [facility] Pager [phone].  If 7PM-7AM, please contact night-coverage [email or web address] Password [facility]  02/11/2017, 10:21 PM   Please document was prepared using Dragon voice recognition software and may contain some unintended errors.

## 2017-02-11 NOTE — ED Notes (Signed)
Patient transported to CT 

## 2017-02-11 NOTE — ED Notes (Signed)
Date and time results received: 02/11/17 1640 (use smartphrase ".now" to insert current time)  Test: Bilirubin  Critical Value: 26.0  Name of Provider Notified: Dr. Rhunette Croft  Orders Received? Or Actions Taken?: Patient needs to be roomed asap.

## 2017-02-11 NOTE — ED Triage Notes (Signed)
Pt complains of nausea, jaundice, abdominal swelling, orange urine. Pt states her symptoms started 10 days ago. Pt states she has had several shots of whiskey every night for the past 10 years. Pt states she has never had liver issues before.

## 2017-02-12 ENCOUNTER — Inpatient Hospital Stay (HOSPITAL_COMMUNITY): Payer: Self-pay

## 2017-02-12 DIAGNOSIS — Z720 Tobacco use: Secondary | ICD-10-CM | POA: Diagnosis present

## 2017-02-12 DIAGNOSIS — K7010 Alcoholic hepatitis without ascites: Secondary | ICD-10-CM

## 2017-02-12 DIAGNOSIS — R718 Other abnormality of red blood cells: Secondary | ICD-10-CM | POA: Diagnosis present

## 2017-02-12 DIAGNOSIS — I5030 Unspecified diastolic (congestive) heart failure: Secondary | ICD-10-CM

## 2017-02-12 DIAGNOSIS — D72829 Elevated white blood cell count, unspecified: Secondary | ICD-10-CM | POA: Diagnosis present

## 2017-02-12 LAB — CBC WITH DIFFERENTIAL/PLATELET
BASOS ABS: 0 10*3/uL (ref 0.0–0.1)
Basophils Relative: 0 %
EOS ABS: 0 10*3/uL (ref 0.0–0.7)
Eosinophils Relative: 0 %
HCT: 33.7 % — ABNORMAL LOW (ref 36.0–46.0)
HEMOGLOBIN: 12.2 g/dL (ref 12.0–15.0)
LYMPHS PCT: 5 %
Lymphs Abs: 1.1 10*3/uL (ref 0.7–4.0)
MCH: 41.2 pg — ABNORMAL HIGH (ref 26.0–34.0)
MCHC: 36.2 g/dL — ABNORMAL HIGH (ref 30.0–36.0)
MCV: 113.9 fL — ABNORMAL HIGH (ref 78.0–100.0)
Monocytes Absolute: 0.7 10*3/uL (ref 0.1–1.0)
Monocytes Relative: 3 %
NEUTROS PCT: 92 %
Neutro Abs: 20.5 10*3/uL — ABNORMAL HIGH (ref 1.7–7.7)
Platelets: 292 10*3/uL (ref 150–400)
RBC: 2.96 MIL/uL — AB (ref 3.87–5.11)
RDW: 17.3 % — ABNORMAL HIGH (ref 11.5–15.5)
WBC: 22.3 10*3/uL — ABNORMAL HIGH (ref 4.0–10.5)

## 2017-02-12 LAB — COMPREHENSIVE METABOLIC PANEL
ALBUMIN: 2.4 g/dL — AB (ref 3.5–5.0)
ALK PHOS: 268 U/L — AB (ref 38–126)
ALT: 48 U/L (ref 14–54)
ANION GAP: 10 (ref 5–15)
AST: 170 U/L — AB (ref 15–41)
CALCIUM: 8.7 mg/dL — AB (ref 8.9–10.3)
CO2: 21 mmol/L — AB (ref 22–32)
Chloride: 103 mmol/L (ref 101–111)
GLUCOSE: 153 mg/dL — AB (ref 65–99)
Potassium: 3.7 mmol/L (ref 3.5–5.1)
SODIUM: 134 mmol/L — AB (ref 135–145)
Total Bilirubin: 24.8 mg/dL (ref 0.3–1.2)
Total Protein: 7.1 g/dL (ref 6.5–8.1)

## 2017-02-12 LAB — LIPID PANEL
Cholesterol: 152 mg/dL (ref 0–200)
Triglycerides: 286 mg/dL — ABNORMAL HIGH (ref <150)
VLDL: 57 mg/dL — ABNORMAL HIGH (ref 0–40)

## 2017-02-12 LAB — LIPASE, BLOOD: LIPASE: 45 U/L (ref 11–51)

## 2017-02-12 LAB — HEPATITIS PANEL, ACUTE
HCV Ab: <0.1 s/co ratio (ref 0.0–0.9)
HEP A IGM: NEGATIVE
HEP B C IGM: NEGATIVE
Hepatitis B Surface Ag: NEGATIVE

## 2017-02-12 LAB — ECHOCARDIOGRAM COMPLETE
Height: 65 in
Weight: 2520 oz

## 2017-02-12 LAB — FOLATE: Folate: 3.8 ng/mL — ABNORMAL LOW (ref >5.9)

## 2017-02-12 LAB — VITAMIN B12: Vitamin B-12: 1224 pg/mL — ABNORMAL HIGH (ref 180–914)

## 2017-02-12 MED ORDER — MAGNESIUM OXIDE 400 (241.3 MG) MG PO TABS
200.0000 mg | ORAL_TABLET | Freq: Every day | ORAL | Status: DC
  Administered 2017-02-12 – 2017-02-18 (×7): 200 mg via ORAL
  Filled 2017-02-12 (×7): qty 1

## 2017-02-12 MED ORDER — SODIUM CHLORIDE 0.9 % IV BOLUS (SEPSIS)
500.0000 mL | Freq: Once | INTRAVENOUS | Status: AC
  Administered 2017-02-12: 500 mL via INTRAVENOUS

## 2017-02-12 MED ORDER — PANTOPRAZOLE SODIUM 40 MG PO TBEC
40.0000 mg | DELAYED_RELEASE_TABLET | Freq: Every day | ORAL | Status: DC
  Administered 2017-02-12 – 2017-02-18 (×6): 40 mg via ORAL
  Filled 2017-02-12 (×7): qty 1

## 2017-02-12 MED ORDER — SODIUM CHLORIDE 0.9 % IV BOLUS (SEPSIS)
150.0000 mL | Freq: Once | INTRAVENOUS | Status: AC
  Administered 2017-02-12: 150 mL via INTRAVENOUS

## 2017-02-12 NOTE — Progress Notes (Signed)
Echocardiogram 2D Echocardiogram has been performed.  Megan PartridgeBrooke S Rita [name] 02/12/2017, 3:50 PM

## 2017-02-12 NOTE — Progress Notes (Signed)
Initial Nutrition Assessment  DOCUMENTATION CODES:   Not applicable  INTERVENTION:   -Continue Boost Breeze TID each supplement provides 250 kcals and 9 grams of protein  -Monitor for diet advancement  NUTRITION DIAGNOSIS:   Inadequate oral intake related to acute illness (early satiety, nausea, poor PO intake) as evidenced by energy intake < or equal to 50% for > or equal to 5 days.  GOAL:   Patient will meet greater than or equal to 90% of their needs  MONITOR:   PO intake, Supplement acceptance, Diet advancement, Labs, Weight trends  REASON FOR ASSESSMENT:   Consult Assessment of nutrition requirement/status  ASSESSMENT:   Pt is a 51 year old female presenting with abdominal distention, nausea, and jaundice. Pt has acute alcoholic hepatitis. PMH of alcohol abuse, anxiety, and HTN.  Pt reports not feeling well for the past 10-12 days and having a decreased appetite. She reports not being able to eat most foods d/t early satiety. Pt would drink zero gatorade, water, and popsicles. Pt reports feeling nauseous twice during the last ten days and vomiting once (after eating a biscuit).   Pt reports that she thought she had lost weight but her weight yesterday was the same as she remembered. Pt reports clothes fitting normally. Per weight records in chart, pt weighed 157 on 02/11/17 and 153 on 05/19/16. Pt says that her UBW fluctuates between 130-160 and she's unsure why.   Pt has not received Boost Breeze yet but is open to trying it and reports liking juice. Pt sipped broth from breakfast tray this morning.   Per MD note, pt drinks several shots of whiskey every night for the past 10 years but has not drank alcohol in the past 6 days.  Medications: Folvite, Mag-Ox, MVI, Protonix, Vitamin B1  Labs: Na 134 (L), Glucose 153 (H), BUN <5 (L), Cr <0.3 (L), Ca 8.7 (L)  Nutrition Focused Physical Exam Findings: No fat depletion. Mild to moderate muscle depletion. No edema.  IVF:  D5 NS with KCl 20 mEq/L @ 88 Dextrose provides 106 kcals  Diet Order:  Diet clear liquid Room service appropriate? Yes; Fluid consistency: Thin  Skin:  Reviewed, no issues (jaundice)  Last BM:  02/12/17  Height:   Ht Readings from Last 1 Encounters:  02/11/17 5\' 5"  (1.651 m)    Weight:   Wt Readings from Last 1 Encounters:  02/11/17 157 lb 8 oz (71.4 kg)    Ideal Body Weight:     BMI:  Body mass index is 26.21 kg/m.  Estimated Nutritional Needs:   Kcal:  1900-2100 kcals  Protein:  90-100 grams protein  Fluid:  >/= 2 L  EDUCATION NEEDS:   No education needs identified at this time  Wynetta EmeryGrace Herman [facility] Intern Pager: [phone] 02/12/2017 11:14 AM

## 2017-02-12 NOTE — Progress Notes (Signed)
Paged Dr. Delight HohE. Mathews to notify about patient's BP 99/59 at 1825. RN will monitor patient's condition.

## 2017-02-12 NOTE — Care Management Note (Signed)
Case Management Note  Patient Details  Name: Megan [name] MRN: [id number] Date of Birth: 03/28/1966  Subjective/Objective:                  etoh abuse, abd. Distention and pain  Action/Plan: Date:  February 12, 2017 Chart reviewed for concurrent status and case management needs.  Will continue to follow patient progress.  Discharge Planning: following for needs  Expected discharge date: [id number]  [name], BSN, [place]RN3, [place]CCM   [phone]   Expected Discharge Date:   (unknown)               Expected Discharge Plan:  Home/Self Care  In-House Referral:     Discharge planning Services  CM Consult  Post Acute Care Choice:    Choice offered to:     DME Arranged:    DME Agency:     HH Arranged:    HH Agency:     Status of Service:  In process, will continue to follow  If discussed at Long Length of Stay Meetings, dates discussed:    Additional Comments:  [name], [name], RN 02/12/2017, 9:57 AM

## 2017-02-12 NOTE — Progress Notes (Signed)
PROGRESS NOTE    Megan [name]  ZOX:[id number] DOB: 13-Sep-1965 DOA: 02/11/2017 PCP: Patient, No Pcp Per   Brief Narrative: 51 y.o. female with medical history significant of alcohol abuse, anxiety, hypertension, tobacco use disorder who is coming to the emergency department with complaints of not feeling well for the past 10 days, when she first began having nausea, decreased appetite, followed by jaundice, dark color urine, decreased urinary volume and abdominal distention for the past 2 days. She mentions that she has been drinking several shots of whiskey every night for the past 10 years. She denies drinking in the past 6 days, withdrawal symptoms or previous detox admission. She denies hepatitis history or exposure. She denies HIV exposure to her knowledge, and declined screening tests. No fever, but positive chills and fatigue. She denies headaches, earaches, sore throat, productive cough with dyspnea. However, she mentions having a recent nosebleed several days ago and bruising easily. No chest pain, dizziness, diaphoresis, orthopnea, PND or pitting edema of the lower extremities. Denies dysuria, frequency or hematuria, but states that her urinary volume has been less than usual. No polyuria, blurred vision, polyphagia or polydipsia.  ED Course: initial vital signs temperature 99.64F, pulse 103, respirations 16, blood pressure 133/82 mmHg and O2 sat 98% on room air. She received 1 L normal saline bolus in the emergency department.  Her workup shows a urine analysis showing mild glucosuria, small hemoglobinuria, moderate bilirubin and mild proteinuria. Microscopic shows many bacteria. WBC was 23.8, hemoglobin 13.2 g/dL and platelets 811. PT 18.8 seconds and INR 1.59. Sodium 131, potassium 3.4, chloride 95 and bicarbonate 22 mmol/L. BUN was 5, creatinine less than 0.30 and glucose 115 mg/dL.her albumin was 2.6 g/dL, total bilirubin 26 mg/dL, AST 914 and alkaline phosphatase 304 units.  CT  abdomen/pelvis with contrast:  1. Liver is moderately enlarged and has diffuse hypoattenuation. 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 Infiltrative tumor could have a similar pattern. 2. Moderate volume of intraperitoneal free fluid presumed related to hepatic dysfunction. 3. Normal pancreas.  Assessment & Plan:   Principal Problem:   Acute alcoholic hepatitis Active Problems:   Hypertension   Hypokalemia   Heart murmur   Anxiety   Elevated MCV   Leukocytosis   Tobacco use 1- Alcoholic hepatitis /Transaminitis/hyperbilirubinemia hepatitis panel negative.gi eval called from er.pending.lfts trending down.ct abdomen no stones/blockage.daily labs.refused hiv test. 2-moderate protein calorie malnutrition 3-Alcohol abuse on ciwa. 4- TObacoo abuse 5- leukocytosiss/p one dose of solumedrol.follow up cultures. 6- Hyponatyremia/Hypokalemia improved.continuw slow ivf.  DVT prophylaxis: scds Code Status: full  Family Communication: none Disposition Plan:  tbd  Consultants: gi pending  Procedures: none  Antimicrobials: none   Subjective: No new complaints.denies nausea,vomiting.co abdominal distension. Objective: Vitals:   02/11/17 1800 02/11/17 1900 02/11/17 2143 02/12/17 0437  BP: 110/83 116/75 110/74 101/65  Pulse: (!) 102 98 (!) 105 91  Resp:  20  16  Temp:   98.4 F (36.9 C) 97.9 F (36.6 C)  TempSrc:   Oral Oral  SpO2:  98% 99% 93%  Weight:   71.4 kg (157 lb 8 oz)   Height:   5\' 5"  (1.651 m)     Intake/Output Summary (Last 24 hours) at 02/12/17 1131 Last data filed at 02/12/17 0835  Gross per 24 hour  Intake           2622.4 ml  Output  0 ml  Net           2622.4 ml   Filed Weights   02/11/17 1454 02/11/17 2143  Weight: 65.8 kg (145 lb) 71.4 kg (157 lb 8 oz)    Examination:  General exam: sclera yellow. Respiratory system: Clear to  auscultation. Respiratory effort normal. Cardiovascular system: S1 & S2 heard, RRR. No JVD, murmurs, rubs, gallops or clicks. No pedal edema. Gastrointestinal system: Abdomen is distended, soft and tender. No organomegaly or masses felt. Normal bowel sounds heard. Central nervous system: Alert and oriented. No focal neurological deficits. Extremities: Symmetric 5 x 5 power. Skin: No rashes, lesions or ulcers Psychiatry: Judgement and insight appear normal. Mood & affect appropriate.     Data Reviewed: I have personally reviewed following labs and imaging studies  CBC:  Recent Labs Lab 02/11/17 1550 02/12/17 0653  WBC 23.8* 22.3*  NEUTROABS 21.5* 20.5*  HGB 13.2 12.2  HCT 36.7 33.7*  MCV 115.4* 113.9*  PLT 290 292   Basic Metabolic Panel:  Recent Labs Lab 02/11/17 1550 02/12/17 0653  NA 131* 134*  K 3.4* 3.7  CL 95* 103  CO2 22 21*  GLUCOSE 115* 153*  BUN 5* <5*  CREATININE <0.30* <0.30*  CALCIUM 9.1 8.7*  MG 1.7  --   PHOS 3.3  --    GFR: CrCl cannot be calculated (This lab value cannot be used to calculate CrCl because it is not a number: <0.30). Liver Function Tests:  Recent Labs Lab 02/11/17 1550 02/12/17 0653  AST 194* 170*  ALT 51 48  ALKPHOS 304* 268*  BILITOT 26.0* 24.8*  PROT 7.8 7.1  ALBUMIN 2.6* 2.4*    Recent Labs Lab 02/11/17 1550 02/12/17 0653  LIPASE 59* 45    Recent Labs Lab 02/11/17 2016  AMMONIA 35   Coagulation Profile:  Recent Labs Lab 02/11/17 1842  INR 1.59   Cardiac Enzymes: No results for input(s): CKTOTAL, CKMB, CKMBINDEX, TROPONINI in the last 168 hours. BNP (last 3 results) No results for input(s): PROBNP in the last 8760 hours. HbA1C: No results for input(s): HGBA1C in the last 72 hours. CBG: No results for input(s): GLUCAP in the last 168 hours. Lipid Profile:  Recent Labs  02/12/17 0653  CHOL 152  HDL <10*  LDLCALC NOT CALCULATED  TRIG 286*  CHOLHDL NOT CALCULATED   Thyroid Function Tests: No  results for input(s): TSH, T4TOTAL, FREET4, T3FREE, THYROIDAB in the last 72 hours. Anemia Panel:  Recent Labs  02/12/17 0653  VITAMINB12 1,224*  FOLATE 3.8*   Sepsis Labs: No results for input(s): PROCALCITON, LATICACIDVEN in the last 168 hours.  No results found for this or any previous visit (from the past 240 hour(s)).       Radiology Studies: Ct Abdomen Pelvis W Contrast  Result Date: 02/11/2017 CLINICAL DATA:  Nausea, jaundice, abdominal swelling. Orange urine. Elevated bilirubin EXAM: CT ABDOMEN AND PELVIS WITH CONTRAST TECHNIQUE: Multidetector CT imaging of the abdomen and pelvis was performed using the standard protocol following bolus administration of intravenous contrast. CONTRAST:  ISOVUE-300 IOPAMIDOL (ISOVUE-300) INJECTION 61% COMPARISON:  CT 08/14/2005 FINDINGS: Lower chest: Lung bases are clear. Hepatobiliary: Moderately enlarged. There is ill-defined hypoattenuation within the LEFT and RIGHT hepatic lobe but more prominent in the RIGHT hepatic lobe. No focal lesion is present. No biliary duct dilatation. The gallbladder is normal. Common bile duct is normal caliber. Pancreas: Pancreas normal. No pancreatic duct dilatation or inflammation. Spleen: Normal spleen Adrenals/urinary tract: Adrenal glands and kidneys  are normal. The ureters and bladder normal. Stomach/Bowel: Stomach duodenum normal. There is a duodenum diverticulum extending along the anti mesenteric border of the second portion the duodenum (2.7 cm image 44, series 2). Colon and rectosigmoid colon normal. Post appendectomy. There multiple diverticula scattered throughout the colon without acute inflammation. There is moderate volume intraperitoneal free fluid along the margin liver and within the leaves of the small bowel and colon mesenteric. Vascular/Lymphatic: Abdominal aorta is normal caliber with atherosclerotic calcification. There is no retroperitoneal or periportal lymphadenopathy. No pelvic  lymphadenopathy. Reproductive: Uterus and ovaries normal Other: Moderate free fluid in the abdomen pelvis is simple fluid attenuation Musculoskeletal: No aggressive osseous lesion. IMPRESSION: . 1. Liver is moderately enlarged and has diffuse hypoattenuation. 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 Infiltrative tumor could have a similar pattern. 2. Moderate volume of intraperitoneal free fluid presumed related to hepatic dysfunction. 3. Normal pancreas. 4. Incidental finding of duodenum diverticulum and colon diverticulosis. Electronically Signed   By: Genevive BiStewart  Edmunds M.D.   On: 02/11/2017 19:14        Scheduled Meds: . feeding supplement  1 Container Oral TID BM  . folic acid  1 mg Oral Daily  . LORazepam  0-4 mg Oral Q6H   Followed by  . [START ON 02/13/2017] LORazepam  0-4 mg Oral Q12H  . magnesium oxide  200 mg Oral Daily  . multivitamin with minerals  1 tablet Oral Daily  . pantoprazole  40 mg Oral Daily  . pentoxifylline  400 mg Oral TID WC  . thiamine  100 mg Oral Daily   Or  . thiamine  100 mg Intravenous Daily   Continuous Infusions: . 0.9 % NaCl with KCl 20 mEq / L 88 mL/hr at 02/11/17 2242     LOS: 1 day     Alwyn RenElizabeth G Jawana Reagor, MD [facility]  If 7PM-7AM, please contact night-coverage [email or web address] Password [email or web address] 02/12/2017, 11:31 AM

## 2017-02-12 NOTE — Consult Note (Signed)
Referring Provider: Dr. Jerolyn CenterMathews Primary Care Physician:  Patient, No Pcp Per Primary Gastroenterologist:  Gentry FitzUnassigned  Reason for Consultation:  Jaundice; Abdominal pain  HPI: Megan PerkingJody [name] is a 51 y.o. female with history of alcohol abuse (drinks 1/5 of alcohol every 2 days for the last 10-12 years) presenting with 2 weeks of jaundice and also having intermittent left-sided abdominal pain.  +N without vomiting. No history of rectal bleeding or black stools. Denies history of hepatitis. Has had chills but denies fevers. Reports no alcohol for the past 7 days. TB 26 (D 15.6), ALP 304, AST 194, ALT 51, WBC 23.8, Hgb 13.2. CT showed hepatomegaly with diffuse hypoattentuation without biliary dilation consistent with acute alcoholic hepatitis.  Husband and son at bedside.  Past Medical History:  Diagnosis Date  . Alcohol abuse   . Anxiety   . Hypertension   . Tobacco use     Past Surgical History:  Procedure Laterality Date  . APPENDECTOMY      Prior to Admission medications   Medication Sig Start Date End Date Taking? Authorizing Provider  Cinnamon 500 MG capsule Take 500 mg by mouth daily.   Yes [provider]  magnesium oxide (MAG-OX) 400 MG tablet Take 250 mg by mouth daily.   Yes [provider]  omeprazole (PRILOSEC) 20 MG capsule Take 20 mg by mouth daily.   Yes [provider]  Pyridoxine HCl (VITAMIN B-6) 250 MG tablet Take 250 mg by mouth daily.   Yes [provider]  amLODipine (NORVASC) 5 MG tablet Take 1 tablet (5 mg total) by mouth daily. Patient not taking: Reported on 02/11/2017 05/14/15   Trixie DredgeWest, Emily, PA-C  HYDROcodone-acetaminophen (NORCO/VICODIN) 5-325 MG tablet Take 1-2 tablets by mouth every 4 (four) hours as needed for moderate pain or severe pain. Patient not taking: Reported on 02/11/2017 05/14/15   Trixie DredgeWest, Emily, PA-C  indomethacin (INDOCIN) 25 MG capsule Take 1-2 capsules (25-50 mg total) by mouth 3 (three) times daily as needed for  mild pain or moderate pain. Patient not taking: Reported on 02/11/2017 05/14/15   Trixie DredgeWest, Emily, PA-C  oxyCODONE-acetaminophen (PERCOCET/ROXICET) 5-325 MG tablet Take 1 tablet by mouth every 4 (four) hours as needed for severe pain. Patient not taking: Reported on 02/11/2017 05/19/16   Tegeler, Canary Brimhristopher J, MD    Scheduled Meds: . feeding supplement  1 Container Oral TID BM  . folic acid  1 mg Oral Daily  . LORazepam  0-4 mg Oral Q6H   Followed by  . [START ON 02/13/2017] LORazepam  0-4 mg Oral Q12H  . magnesium oxide  200 mg Oral Daily  . multivitamin with minerals  1 tablet Oral Daily  . pantoprazole  40 mg Oral Daily  . pentoxifylline  400 mg Oral TID WC  . thiamine  100 mg Oral Daily   Or  . thiamine  100 mg Intravenous Daily   Continuous Infusions: . 0.9 % NaCl with KCl 20 mEq / L 88 mL/hr at 02/12/17 1137   PRN Meds:.LORazepam **OR** LORazepam, ondansetron **OR** ondansetron (ZOFRAN) IV  Allergies as of 02/11/2017 - Review Complete 02/11/2017  Allergen Reaction Noted  . Amoxicillin  10/20/2014  . Macrobid [nitrofurantoin monohyd macro]  10/20/2014  . Sulfa antibiotics  10/20/2014    Family History  Problem Relation Age of Onset  . Lung cancer Mother   . COPD Mother        Emphysema  . Lung cancer Father   . Cystic fibrosis Sister   . Atrial  fibrillation Sister     Social History   Social History  . Marital status: Married    Spouse name: N/A  . Number of children: N/A  . Years of education: N/A   Occupational History  . Not on file.   Social History Main Topics  . Smoking status: Current Every Day Smoker  . Smokeless tobacco: Never Used  . Alcohol use Yes  . Drug use: No  . Sexual activity: Not on file   Other Topics Concern  . Not on file   Social History Narrative  . No narrative on file    Review of Systems: All negative except as stated above in HPI.  Physical Exam: Vital signs: Vitals:   02/11/17 2143 02/12/17 0437  BP: 110/74 101/65   Pulse: (!) 105 91  Resp:  16  Temp: 98.4 F (36.9 C) 97.9 F (36.6 C)  SpO2: 99% 93%   Last BM Date: 02/12/17 General:   Lethargic, jaundice, Well-developed, well-nourished, pleasant and cooperative in NAD Head: normocephalic, atraumatic Eyes: +scleral icterus ENT: oropharynx clear Neck: supple, nontender Lungs:  Clear throughout to auscultation.   No wheezes, crackles, or rhonchi. No acute distress. Heart:  Regular rate and rhythm; no murmurs, clicks, rubs,  or gallops. Abdomen: diffuse tenderness with guarding, +distention, +BS  Rectal:  Deferred Ext: no edema Skin: +spider angiomas on chest  GI:  Lab Results:  Recent Labs  02/11/17 1550 02/12/17 0653  WBC 23.8* 22.3*  HGB 13.2 12.2  HCT 36.7 33.7*  PLT 290 292   BMET  Recent Labs  02/11/17 1550 02/12/17 0653  NA 131* 134*  K 3.4* 3.7  CL 95* 103  CO2 22 21*  GLUCOSE 115* 153*  BUN 5* <5*  CREATININE <0.30* <0.30*  CALCIUM 9.1 8.7*   LFT  Recent Labs  02/11/17 1550 02/12/17 0653  PROT 7.8 7.1  ALBUMIN 2.6* 2.4*  AST 194* 170*  ALT 51 48  ALKPHOS 304* 268*  BILITOT 26.0* 24.8*  BILIDIR 15.6*  --    PT/INR  Recent Labs  02/11/17 1842  LABPROT 18.8*  INR 1.59     Studies/Results: Ct Abdomen Pelvis W Contrast  Result Date: 02/11/2017 CLINICAL DATA:  Nausea, jaundice, abdominal swelling. Orange urine. Elevated bilirubin EXAM: CT ABDOMEN AND PELVIS WITH CONTRAST TECHNIQUE: Multidetector CT imaging of the abdomen and pelvis was performed using the standard protocol following bolus administration of intravenous contrast. CONTRAST:  ISOVUE-300 IOPAMIDOL (ISOVUE-300) INJECTION 61% COMPARISON:  CT 08/14/2005 FINDINGS: Lower chest: Lung bases are clear. Hepatobiliary: Moderately enlarged. There is ill-defined hypoattenuation within the LEFT and RIGHT hepatic lobe but more prominent in the RIGHT hepatic lobe. No focal lesion is present. No biliary duct dilatation. The gallbladder is normal.  Common bile duct is normal caliber. Pancreas: Pancreas normal. No pancreatic duct dilatation or inflammation. Spleen: Normal spleen Adrenals/urinary tract: Adrenal glands and kidneys are normal. The ureters and bladder normal. Stomach/Bowel: Stomach duodenum normal. There is a duodenum diverticulum extending along the anti mesenteric border of the second portion the duodenum (2.7 cm image 44, series 2). Colon and rectosigmoid colon normal. Post appendectomy. There multiple diverticula scattered throughout the colon without acute inflammation. There is moderate volume intraperitoneal free fluid along the margin liver and within the leaves of the small bowel and colon mesenteric. Vascular/Lymphatic: Abdominal aorta is normal caliber with atherosclerotic calcification. There is no retroperitoneal or periportal lymphadenopathy. No pelvic lymphadenopathy. Reproductive: Uterus and ovaries normal Other: Moderate free fluid in the abdomen pelvis is  simple fluid attenuation Musculoskeletal: No aggressive osseous lesion. IMPRESSION: . 1. Liver is moderately enlarged and has diffuse hypoattenuation. 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 Infiltrative tumor could have a similar pattern. 2. Moderate volume of intraperitoneal free fluid presumed related to hepatic dysfunction. 3. Normal pancreas. 4. Incidental finding of duodenum diverticulum and colon diverticulosis. Electronically Signed   By: Genevive Bi M.D.   On: 02/11/2017 19:14    Impression/Plan: Alcoholic hepatitis on [place]. Hepatitis panel negative. Wants to eat so will start soft diet. Follow labs closely. Continue supportive care. Discussed need to stop alcohol completely. Will follow.    LOS: 1 day   Jesselee Poth C.  02/12/2017, 3:39 PM  Pager [phone]  AFTER 5 pm or on weekends please call [phone]

## 2017-02-13 ENCOUNTER — Inpatient Hospital Stay (HOSPITAL_COMMUNITY): Payer: Self-pay

## 2017-02-13 LAB — CBC WITH DIFFERENTIAL/PLATELET
BASOS PCT: 0 %
Basophils Absolute: 0 10*3/uL (ref 0.0–0.1)
EOS PCT: 0 %
Eosinophils Absolute: 0 10*3/uL (ref 0.0–0.7)
HEMATOCRIT: 30.4 % — AB (ref 36.0–46.0)
HEMOGLOBIN: 10.8 g/dL — AB (ref 12.0–15.0)
LYMPHS ABS: 1.9 10*3/uL (ref 0.7–4.0)
Lymphocytes Relative: 8 %
MCH: 41.2 pg — ABNORMAL HIGH (ref 26.0–34.0)
MCHC: 35.5 g/dL (ref 30.0–36.0)
MCV: 116 fL — AB (ref 78.0–100.0)
Monocytes Absolute: 1.9 10*3/uL — ABNORMAL HIGH (ref 0.1–1.0)
Monocytes Relative: 8 %
NEUTROS ABS: 19.7 10*3/uL — AB (ref 1.7–7.7)
Neutrophils Relative %: 84 %
Platelets: 305 10*3/uL (ref 150–400)
RBC: 2.62 MIL/uL — AB (ref 3.87–5.11)
RDW: 17 % — ABNORMAL HIGH (ref 11.5–15.5)
WBC: 23.5 10*3/uL — AB (ref 4.0–10.5)

## 2017-02-13 LAB — COMPREHENSIVE METABOLIC PANEL
ALK PHOS: 248 U/L — AB (ref 38–126)
ALT: 47 U/L (ref 14–54)
ANION GAP: 10 (ref 5–15)
AST: 201 U/L — ABNORMAL HIGH (ref 15–41)
Albumin: 2.1 g/dL — ABNORMAL LOW (ref 3.5–5.0)
BILIRUBIN TOTAL: 21.5 mg/dL — AB (ref 0.3–1.2)
BUN: 7 mg/dL (ref 6–20)
CALCIUM: 8.4 mg/dL — AB (ref 8.9–10.3)
CO2: 20 mmol/L — ABNORMAL LOW (ref 22–32)
Chloride: 104 mmol/L (ref 101–111)
Creatinine, Ser: <0.3 mg/dL — ABNORMAL LOW (ref 0.44–1.00)
GLUCOSE: 108 mg/dL — AB (ref 65–99)
Potassium: 3.8 mmol/L (ref 3.5–5.1)
Sodium: 134 mmol/L — ABNORMAL LOW (ref 135–145)
Total Protein: 6.2 g/dL — ABNORMAL LOW (ref 6.5–8.1)

## 2017-02-13 MED ORDER — ALPRAZOLAM 0.5 MG PO TABS
0.5000 mg | ORAL_TABLET | Freq: Two times a day (BID) | ORAL | Status: DC | PRN
  Administered 2017-02-13 (×2): 0.5 mg via ORAL
  Filled 2017-02-13 (×2): qty 1

## 2017-02-13 MED ORDER — TRAMADOL HCL 50 MG PO TABS
50.0000 mg | ORAL_TABLET | Freq: Four times a day (QID) | ORAL | Status: DC | PRN
  Administered 2017-02-13 – 2017-02-14 (×3): 50 mg via ORAL
  Filled 2017-02-13 (×3): qty 1

## 2017-02-13 MED ORDER — RAMELTEON 8 MG PO TABS
8.0000 mg | ORAL_TABLET | Freq: Every day | ORAL | Status: DC
  Filled 2017-02-13: qty 1

## 2017-02-13 NOTE — Progress Notes (Signed)
[facility] Progress Note  Megan PerkingJody Keil 51 y.o. 08/23/1965   Subjective: Frustrated with bloating sensation and ongoing jaundice. Denies abdominal pain. Tolerating solid food without N/V. Family at bedside.  Objective: Vital signs: Vitals:   02/13/17 0437 02/13/17 1400  BP: 108/74 113/73  Pulse: 99 93  Resp: (!) 22 (!) 22  Temp: 97.8 F (36.6 C) 98.2 F (36.8 C)  SpO2: 95% 97%    Physical Exam: Gen: lethargic, no acute distress, jaundice HEENT: + scleral icterus CV: RRR Chest: CTA B Abd: +distention, nontender, +BS Ext: no edema Skin: +spider angiomas  Lab Results:  Recent Labs  02/11/17 1550 02/12/17 0653 02/13/17 0702  NA 131* 134* 134*  K 3.4* 3.7 3.8  CL 95* 103 104  CO2 22 21* 20*  GLUCOSE 115* 153* 108*  BUN 5* <5* 7  CREATININE <0.30* <0.30* <0.30*  CALCIUM 9.1 8.7* 8.4*  MG 1.7  --   --   PHOS 3.3  --   --     Recent Labs  02/12/17 0653 02/13/17 0702  AST 170* 201*  ALT 48 47  ALKPHOS 268* 248*  BILITOT 24.8* 21.5*  PROT 7.1 6.2*  ALBUMIN 2.4* 2.1*    Recent Labs  02/12/17 0653 02/13/17 0702  WBC 22.3* 23.5*  NEUTROABS 20.5* 19.7*  HGB 12.2 10.8*  HCT 33.7* 30.4*  MCV 113.9* 116.0*  PLT 292 305      Assessment/Plan: Alcoholic hepatitis on [place] with improving total bilirubin. WBC remains markedly elevated. 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 She states that she does not plan to drink anymore alcohol. If liver enzymes begin to improve and WBC starts to improve and tolerating diet then she can go home in the next 1-2 days. Stressed need to remain abstinence from alcohol. F/U with me in 4-6 weeks. Will sign off. Call if questions.   Aadarsh Cozort C. 02/13/2017, 2:34 PM  Pager [phone]  AFTER 5 PM or on weekends please call [phone] ID: Megan PerkingJody Gastineau, female   DOB: 03/25/1966, 51 y.o.   MRN: [id number]

## 2017-02-13 NOTE — Progress Notes (Signed)
PROGRESS NOTE    Megan [name]  ZOX:[id number] DOB: [birth year] DOA: 02/11/2017 PCP: Patient, No Pcp Per   Brief Narrative:  52 y.o.femalewith medical history significant of alcohol abuse, anxiety, hypertension, tobacco use disorder who is coming to the emergency department with complaints of not feeling well for the past 10 days, when she first began having nausea, decreased appetite, followed by jaundice, dark color urine, decreased urinary volume and abdominal distention for the past 2 days. She mentions that she has been drinking several shots of whiskey every night for the past 10 years. She denies drinking in the past 6 days, withdrawal symptoms or previous detox admission. She denies hepatitis history or exposure. She denies HIV exposure to her knowledge, and declined screening tests. No fever, but positive chills and fatigue. She denies headaches, earaches, sore throat, productive cough with dyspnea. However, she mentions having a recent nosebleed several days ago and bruising easily. No chest pain, dizziness, diaphoresis, orthopnea, PND or pitting edema of the lower extremities. Denies dysuria, frequency or hematuria, but states that her urinary volume has been less than usual. No polyuria, blurred vision, polyphagia or polydipsia.  ED Course:initial vital signs temperature 99.49F, pulse 103, respirations 16, blood pressure 133/82 mmHg and O2 sat 98% on room air. She received 1 L normal saline bolus in the emergency department.  Her workup shows a urine analysis showing mild glucosuria, small hemoglobinuria, moderate bilirubin and mild proteinuria. Microscopic shows many bacteria. WBC was 23.8, hemoglobin 13.2 g/dL and platelets 811. PT 18.8 seconds and INR 1.59. Sodium 131, potassium 3.4, chloride 95 and bicarbonate 22 mmol/L. BUN was 5, creatinine less than 0.30 and glucose 115 mg/dL.her albumin was 2.6 g/dL, total bilirubin 26 mg/dL, AST 914 and alkaline phosphatase 304 units.  Patient  feels better today than yesterday.  Assessment & Plan:   Principal Problem:   Acute alcoholic hepatitis Active Problems:   Hypertension   Hypokalemia   Heart murmur   Anxiety   Elevated MCV   Leukocytosis   Tobacco use   1- Alcoholic hepatitis /Transaminitis/hyperbilirubinemia hepatitis panel negative.gi eval appreciated.lfts trending down.ct abdomen no stones/blockage.daily labs.refused hiv test. 2-moderate protein calorie malnutrition 3-Alcohol abuse on ciwa. 4- TObacoo abuse 5- leukocytosiss/p one dose of solumedrol.follow up cultures.check chest xray kub.ua unremarkable.?stress reaction. 6- Hyponatyremia/Hypokalemia improved discontinued ivf.she has peritoneal fluid by ct and exam-ascites.monitor. DVT prophylaxis: scd Code Status: full Family Communication: none Disposition Plan:  tbd  Consultants: gi  Procedures none Antimicrobials:  none  Subjective:feels better   Objective: Vitals:   02/12/17 1829 02/12/17 2128 02/13/17 0437 02/13/17 1400  BP: (!) 99/59 103/61 108/74 113/73  Pulse: (!) 108 (!) 115 99 93  Resp:  20 (!) 22 (!) 22  Temp: 98.7 F (37.1 C) 98 F (36.7 C) 97.8 F (36.6 C) 98.2 F (36.8 C)  TempSrc: Oral Oral Oral Oral  SpO2: 97% 96% 95% 97%  Weight:      Height:        Intake/Output Summary (Last 24 hours) at 02/13/17 1708 Last data filed at 02/13/17 0629  Gross per 24 hour  Intake             1056 ml  Output                0 ml  Net             1056 ml   Filed Weights   02/11/17 1454 02/11/17 2143  Weight: 65.8 kg (145 lb) 71.4 kg (157 lb 8  oz)    Examination:  General exam: Appears calm and comfortable  Respiratory system: Clear to auscultation. Respiratory effort normal. Cardiovascular system: S1 & S2 heard, RRR. No JVD, murmurs, rubs, gallops or clicks. No pedal edema. Gastrointestinal system: Abdomen is distended, soft and  generalizedtender. No organomegaly or masses felt. Normal bowel sounds heard. Central nervous  system: Alert and oriented. No focal neurological deficits. Extremities: Symmetric 5 x 5 power. Skin: No rashes, lesions or ulcers Psychiatry: Judgement and insight appear normal. Mood & affect appropriate.     Data Reviewed: I have personally reviewed following labs and imaging studies  CBC:  Recent Labs Lab 02/11/17 1550 02/12/17 0653 02/13/17 0702  WBC 23.8* 22.3* 23.5*  NEUTROABS 21.5* 20.5* 19.7*  HGB 13.2 12.2 10.8*  HCT 36.7 33.7* 30.4*  MCV 115.4* 113.9* 116.0*  PLT 290 292 305   Basic Metabolic Panel:  Recent Labs Lab 02/11/17 1550 02/12/17 0653 02/13/17 0702  NA 131* 134* 134*  K 3.4* 3.7 3.8  CL 95* 103 104  CO2 22 21* 20*  GLUCOSE 115* 153* 108*  BUN 5* <5* 7  CREATININE <0.30* <0.30* <0.30*  CALCIUM 9.1 8.7* 8.4*  MG 1.7  --   --   PHOS 3.3  --   --    GFR: CrCl cannot be calculated (This lab value cannot be used to calculate CrCl because it is not a number: <0.30). Liver Function Tests:  Recent Labs Lab 02/11/17 1550 02/12/17 0653 02/13/17 0702  AST 194* 170* 201*  ALT 51 48 47  ALKPHOS 304* 268* 248*  BILITOT 26.0* 24.8* 21.5*  PROT 7.8 7.1 6.2*  ALBUMIN 2.6* 2.4* 2.1*    Recent Labs Lab 02/11/17 1550 02/12/17 0653  LIPASE 59* 45    Recent Labs Lab 02/11/17 2016  AMMONIA 35   Coagulation Profile:  Recent Labs Lab 02/11/17 1842  INR 1.59   Cardiac Enzymes: No results for input(s): CKTOTAL, CKMB, CKMBINDEX, TROPONINI in the last 168 hours. BNP (last 3 results) No results for input(s): PROBNP in the last 8760 hours. HbA1C: No results for input(s): HGBA1C in the last 72 hours. CBG: No results for input(s): GLUCAP in the last 168 hours. Lipid Profile:  Recent Labs  02/12/17 0653  CHOL 152  HDL <10*  LDLCALC NOT CALCULATED  TRIG 286*  CHOLHDL NOT CALCULATED   Thyroid Function Tests: No results for input(s): TSH, T4TOTAL, FREET4, T3FREE, THYROIDAB in the last 72 hours. Anemia Panel:  Recent Labs   02/12/17 0653  VITAMINB12 1,224*  FOLATE 3.8*   Sepsis Labs: No results for input(s): PROCALCITON, LATICACIDVEN in the last 168 hours.  No results found for this or any previous visit (from the past 240 hour(s)).       Radiology Studies: Ct Abdomen Pelvis W Contrast  Result Date: 02/11/2017 CLINICAL DATA:  Nausea, jaundice, abdominal swelling. Orange urine. Elevated bilirubin EXAM: CT ABDOMEN AND PELVIS WITH CONTRAST TECHNIQUE: Multidetector CT imaging of the abdomen and pelvis was performed using the standard protocol following bolus administration of intravenous contrast. CONTRAST:  ISOVUE-300 IOPAMIDOL (ISOVUE-300) INJECTION 61% COMPARISON:  CT 08/14/2005 FINDINGS: Lower chest: Lung bases are clear. Hepatobiliary: Moderately enlarged. There is ill-defined hypoattenuation within the LEFT and RIGHT hepatic lobe but more prominent in the RIGHT hepatic lobe. No focal lesion is present. No biliary duct dilatation. The gallbladder is normal. Common bile duct is normal caliber. Pancreas: Pancreas normal. No pancreatic duct dilatation or inflammation. Spleen: Normal spleen Adrenals/urinary tract: Adrenal glands and kidneys are  normal. The ureters and bladder normal. Stomach/Bowel: Stomach duodenum normal. There is a duodenum diverticulum extending along the anti mesenteric border of the second portion the duodenum (2.7 cm image 44, series 2). Colon and rectosigmoid colon normal. Post appendectomy. There multiple diverticula scattered throughout the colon without acute inflammation. There is moderate volume intraperitoneal free fluid along the margin liver and within the leaves of the small bowel and colon mesenteric. Vascular/Lymphatic: Abdominal aorta is normal caliber with atherosclerotic calcification. There is no retroperitoneal or periportal lymphadenopathy. No pelvic lymphadenopathy. Reproductive: Uterus and ovaries normal Other: Moderate free fluid in the abdomen pelvis is simple fluid  attenuation Musculoskeletal: No aggressive osseous lesion. IMPRESSION: . 1. Liver is moderately enlarged and has diffuse hypoattenuation. 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 Infiltrative tumor could have a similar pattern. 2. Moderate volume of intraperitoneal free fluid presumed related to hepatic dysfunction. 3. Normal pancreas. 4. Incidental finding of duodenum diverticulum and colon diverticulosis. Electronically Signed   By: Genevive BiStewart  Edmunds M.D.   On: 02/11/2017 19:14        Scheduled Meds: . feeding supplement  1 Container Oral TID BM  . folic acid  1 mg Oral Daily  . LORazepam  0-4 mg Oral Q6H   Followed by  . LORazepam  0-4 mg Oral Q12H  . magnesium oxide  200 mg Oral Daily  . multivitamin with minerals  1 tablet Oral Daily  . pantoprazole  40 mg Oral Daily  . pentoxifylline  400 mg Oral TID WC  . thiamine  100 mg Oral Daily   Continuous Infusions:   LOS: 2 days      Alwyn RenElizabeth G Mathews, MD [facility]  If 7PM-7AM, please contact night-coverage [email or web address] Password [email or web address] 02/13/2017, 5:08 PM

## 2017-02-14 LAB — CBC WITH DIFFERENTIAL/PLATELET
Basophils Absolute: 0 10*3/uL (ref 0.0–0.1)
Basophils Relative: 0 %
Eosinophils Absolute: 0 10*3/uL (ref 0.0–0.7)
Eosinophils Relative: 0 %
HCT: 33.7 % — ABNORMAL LOW (ref 36.0–46.0)
HEMOGLOBIN: 12.1 g/dL (ref 12.0–15.0)
LYMPHS PCT: 10 %
Lymphs Abs: 2.5 10*3/uL (ref 0.7–4.0)
MCH: 41.2 pg — ABNORMAL HIGH (ref 26.0–34.0)
MCHC: 35.9 g/dL (ref 30.0–36.0)
MCV: 114.6 fL — ABNORMAL HIGH (ref 78.0–100.0)
MONOS PCT: 7 %
Monocytes Absolute: 1.7 10*3/uL — ABNORMAL HIGH (ref 0.1–1.0)
NEUTROS PCT: 83 %
Neutro Abs: 20.7 10*3/uL — ABNORMAL HIGH (ref 1.7–7.7)
Platelets: 305 10*3/uL (ref 150–400)
RBC: 2.94 MIL/uL — AB (ref 3.87–5.11)
RDW: 17.1 % — ABNORMAL HIGH (ref 11.5–15.5)
WBC: 24.9 10*3/uL — AB (ref 4.0–10.5)

## 2017-02-14 LAB — COMPREHENSIVE METABOLIC PANEL
ALBUMIN: 2.3 g/dL — AB (ref 3.5–5.0)
ALK PHOS: 288 U/L — AB (ref 38–126)
ALT: 63 U/L — AB (ref 14–54)
ANION GAP: 11 (ref 5–15)
AST: 257 U/L — AB (ref 15–41)
BUN: 9 mg/dL (ref 6–20)
CALCIUM: 9 mg/dL (ref 8.9–10.3)
CO2: 20 mmol/L — AB (ref 22–32)
Chloride: 102 mmol/L (ref 101–111)
Creatinine, Ser: <0.3 mg/dL — ABNORMAL LOW (ref 0.44–1.00)
GLUCOSE: 99 mg/dL (ref 65–99)
Potassium: 3.7 mmol/L (ref 3.5–5.1)
SODIUM: 133 mmol/L — AB (ref 135–145)
Total Bilirubin: 25 mg/dL (ref 0.3–1.2)
Total Protein: 7.1 g/dL (ref 6.5–8.1)

## 2017-02-14 MED ORDER — CIPROFLOXACIN IN D5W 400 MG/200ML IV SOLN
400.0000 mg | Freq: Once | INTRAVENOUS | Status: AC
Start: 2017-02-14 — End: 2017-02-14
  Administered 2017-02-14: 400 mg via INTRAVENOUS
  Filled 2017-02-14: qty 200

## 2017-02-14 MED ORDER — CIPROFLOXACIN IN D5W 400 MG/200ML IV SOLN
400.0000 mg | Freq: Two times a day (BID) | INTRAVENOUS | Status: DC
  Administered 2017-02-15 – 2017-02-16 (×3): 400 mg via INTRAVENOUS
  Filled 2017-02-14 (×3): qty 200

## 2017-02-14 MED ORDER — METRONIDAZOLE IN NACL 5-0.79 MG/ML-% IV SOLN
500.0000 mg | Freq: Three times a day (TID) | INTRAVENOUS | Status: DC
  Administered 2017-02-14 – 2017-02-16 (×6): 500 mg via INTRAVENOUS
  Filled 2017-02-14 (×7): qty 100

## 2017-02-14 MED ORDER — ALBUMIN HUMAN 25 % IV SOLN
12.5000 g | Freq: Once | INTRAVENOUS | Status: AC
  Administered 2017-02-14: 12.5 g via INTRAVENOUS
  Filled 2017-02-14: qty 50

## 2017-02-15 ENCOUNTER — Inpatient Hospital Stay (HOSPITAL_COMMUNITY): Payer: Self-pay

## 2017-02-15 LAB — CBC
HEMATOCRIT: 31.7 % — AB (ref 36.0–46.0)
HEMOGLOBIN: 11.2 g/dL — AB (ref 12.0–15.0)
MCH: 40.6 pg — ABNORMAL HIGH (ref 26.0–34.0)
MCHC: 35.3 g/dL (ref 30.0–36.0)
MCV: 114.9 fL — ABNORMAL HIGH (ref 78.0–100.0)
Platelets: 278 10*3/uL (ref 150–400)
RBC: 2.76 MIL/uL — ABNORMAL LOW (ref 3.87–5.11)
RDW: 16.7 % — ABNORMAL HIGH (ref 11.5–15.5)
WBC: 20.8 10*3/uL — AB (ref 4.0–10.5)

## 2017-02-15 LAB — COMPREHENSIVE METABOLIC PANEL
ALBUMIN: 2.5 g/dL — AB (ref 3.5–5.0)
ALT: 53 U/L (ref 14–54)
ALT: 62 U/L — AB (ref 14–54)
ANION GAP: 15 (ref 5–15)
AST: 197 U/L — ABNORMAL HIGH (ref 15–41)
AST: 209 U/L — AB (ref 15–41)
Albumin: 2.4 g/dL — ABNORMAL LOW (ref 3.5–5.0)
Alkaline Phosphatase: 245 U/L — ABNORMAL HIGH (ref 38–126)
Alkaline Phosphatase: 261 U/L — ABNORMAL HIGH (ref 38–126)
Anion gap: 13 (ref 5–15)
BILIRUBIN TOTAL: 24.5 mg/dL — AB (ref 0.3–1.2)
BUN: 8 mg/dL (ref 6–20)
BUN: 9 mg/dL (ref 6–20)
CALCIUM: 8.7 mg/dL — AB (ref 8.9–10.3)
CHLORIDE: 102 mmol/L (ref 101–111)
CO2: 17 mmol/L — ABNORMAL LOW (ref 22–32)
CO2: 19 mmol/L — AB (ref 22–32)
Calcium: 9.1 mg/dL (ref 8.9–10.3)
Chloride: 101 mmol/L (ref 101–111)
Creatinine, Ser: <0.3 mg/dL — ABNORMAL LOW (ref 0.44–1.00)
GLUCOSE: 93 mg/dL (ref 65–99)
Glucose, Bld: 109 mg/dL — ABNORMAL HIGH (ref 65–99)
POTASSIUM: 3.6 mmol/L (ref 3.5–5.1)
Potassium: 3.6 mmol/L (ref 3.5–5.1)
SODIUM: 134 mmol/L — AB (ref 135–145)
Sodium: 133 mmol/L — ABNORMAL LOW (ref 135–145)
TOTAL PROTEIN: 6.4 g/dL — AB (ref 6.5–8.1)
Total Bilirubin: 26.9 mg/dL (ref 0.3–1.2)
Total Protein: 6.9 g/dL (ref 6.5–8.1)

## 2017-02-15 LAB — BODY FLUID CELL COUNT WITH DIFFERENTIAL
EOS FL: 0 %
Lymphs, Fluid: 52 %
Monocyte-Macrophage-Serous Fluid: 25 % — ABNORMAL LOW (ref 50–90)
NEUTROPHIL FLUID: 23 % (ref 0–25)
Total Nucleated Cell Count, Fluid: 70 cu mm (ref 0–1000)

## 2017-02-15 LAB — PROTEIN, PLEURAL OR PERITONEAL FLUID: Total protein, fluid: <3 g/dL

## 2017-02-15 LAB — GLUCOSE, PLEURAL OR PERITONEAL FLUID: GLUCOSE FL: 99 mg/dL

## 2017-02-15 LAB — AFP TUMOR MARKER: AFP, Serum, Tumor Marker: 7.2 ng/mL (ref 0.0–8.3)

## 2017-02-15 LAB — LACTATE DEHYDROGENASE, PLEURAL OR PERITONEAL FLUID: LD, Fluid: 36 U/L — ABNORMAL HIGH (ref 3–23)

## 2017-02-15 MED ORDER — CIPROFLOXACIN IN D5W 400 MG/200ML IV SOLN: 400.0000 mg | Freq: Two times a day (BID) | INTRAVENOUS | Status: DC

## 2017-02-15 MED ORDER — POLYETHYLENE GLYCOL 3350 17 GM/SCOOP PO POWD: 1.0000 | Freq: Every morning | ORAL | Status: DC

## 2017-02-15 MED ORDER — BISACODYL 5 MG PO TBEC
10.0000 mg | DELAYED_RELEASE_TABLET | Freq: Every day | ORAL | Status: DC
  Filled 2017-02-15 (×2): qty 2

## 2017-02-15 MED ORDER — SPIRONOLACTONE 25 MG PO TABS
50.0000 mg | ORAL_TABLET | Freq: Two times a day (BID) | ORAL | Status: DC
  Administered 2017-02-15 – 2017-02-18 (×6): 50 mg via ORAL
  Filled 2017-02-15 (×6): qty 2

## 2017-02-15 MED ORDER — POLYETHYLENE GLYCOL 3350 17 G PO PACK
17.0000 g | PACK | Freq: Every day | ORAL | Status: DC
  Filled 2017-02-15: qty 1

## 2017-02-15 MED ORDER — ZOLPIDEM TARTRATE 5 MG PO TABS
5.0000 mg | ORAL_TABLET | Freq: Every evening | ORAL | Status: DC | PRN
  Administered 2017-02-15: 5 mg via ORAL
  Filled 2017-02-15: qty 1

## 2017-02-15 MED ORDER — BISACODYL 10 MG RE SUPP: 10.0000 mg | Freq: Once | RECTAL | Status: DC

## 2017-02-15 MED ORDER — LACTULOSE 10 GM/15ML PO SOLN
30.0000 g | Freq: Once | ORAL | Status: DC
  Filled 2017-02-15 (×2): qty 45

## 2017-02-15 NOTE — Procedures (Signed)
PROCEDURE SUMMARY:  Successful US guided paracentesis from LLQ.  Yielded 3.1 L of clear bright yellow fluid.  No immediate complications.  Pt tolerated well.   Specimen was sent for labs.  Brayton ElBRUNING, Kamalei Roeder PA-C 02/15/2017 4:14 PM

## 2017-02-15 NOTE — Progress Notes (Signed)
Alerted via text to MD the total bilirubin level of 26.9 at this time. MD returned call. No new orders at this time.

## 2017-02-15 NOTE — Progress Notes (Signed)
Patient quite uncomfortable because of abdominal distention, presumably due to ascites.  On exam, the patient hascutaneous stigmata of chronic liver disease, with multiple spiders and palmar erythema. She is jaundiced. The abdomen is quite distended, equal to about 8 or 9 months of pregnancy. There is no asterixis, the patient is oriented to month but not year  ("1918"), is able to do serial twos quickly and accurately. No peripheral edema.  Lab work shows slight improvement in transaminases, slight rise in bilirubin.  White count remains elevated although it is slightly improved today.  Of note, patient's INR was normal at 1.4 yesterday.  Case discussed with Dr. Ashley RoyaltyMatthews. Agree with empiric antibiotics at least until the peritoneal fluid has been obtained and confirmed to not show evidence of current or partially treated infection (for example, absolute neutrophil count less than 100).  Impression: Moderately severe alcoholic hepatitis without evidence of encephalopathy. Leukocytosis is probably due to her hepatitis rather than an infection.  Recommendations:  1. As mentioned above, I agree with empiric antibiotic therapy until the peritoneal fluid is sampled, but hopefully the antibiotics can be stopped thereafter, if it looks clear of infection  2. Have changed patient to a low-sodium diet and advised her to not consume the Gatorade sitting at her bedside, because of its sodium content.  3. Have started spironolactone 50 mg twice a day. It will take about 3 days for this to kick in, after which the dose might be increased to 100 mg twice a day, and/or Lasix added to the regimen, assuming her renal function remains okay  4. Given absence of encephalopathy, I do not think the patient would likely benefit from steroids to treat her alcoholic hepatitis.  Megan Reasonsobert V. Bhavik Cabiness, M.D. Pager [phone] If no answer or after 5 PM call [phone]

## 2017-02-15 NOTE — Progress Notes (Signed)
PROGRESS NOTE    Don PerkingJody Mcconico  ZOX:[id number] DOB: 12/23/1965 DOA: 02/11/2017 PCP: Patient, No Pcp Per    Brief Narrative: 51 y.o.femalewith medical history significant of alcohol abuse, anxiety, hypertension, tobacco use disorder who is coming to the emergency department with complaints of not feeling well for the past 10 days, when she first began having nausea, decreased appetite, followed by jaundice, dark color urine, decreased urinary volume and abdominal distention for the past 2 days. She mentions that she has been drinking several shots of whiskey every night for the past 10 years. She denies drinking in the past 6 days, withdrawal symptoms or previous detox admission. She denies hepatitis history or exposure. She denies HIV exposure to her knowledge, and declined screening tests. No fever, but positive chills and fatigue. She denies headaches, earaches, sore throat, productive cough with dyspnea. However, she mentions having a recent nosebleed several days ago and bruising easily. No chest pain, dizziness, diaphoresis, orthopnea, PND or pitting edema of the lower extremities. Denies dysuria, frequency or hematuria, but states that her urinary volume has been less than usual. No polyuria, blurred vision, polyphagia or polydipsia.  ED Course:initial vital signs temperature 99.90F, pulse 103, respirations 16, blood pressure 133/82 mmHg and O2 sat 98% on room air. She received 1 L normal saline bolus in the emergency department.  Her workup shows a urine analysis showing mild glucosuria, small hemoglobinuria, moderate bilirubin and mild proteinuria. Microscopic shows many bacteria. WBC was 23.8, hemoglobin 13.2 g/dL and platelets 811290. PT 18.8 seconds and INR 1.59. Sodium 131, potassium 3.4, chloride 95 and bicarbonate 22 mmol/L. BUN was 5, creatinine less than 0.30 and glucose 115 mg/dL.her albumin was 2.6 g/dL, total bilirubin 26 mg/dL, AST 914194 and alkaline phosphatase 304 units.  Patient  feels better today than yesterday.  Having no BM for 7 days.   Assessment & Plan:   Principal Problem:   Acute alcoholic hepatitis Active Problems:   Hypertension   Hypokalemia   Heart murmur   Anxiety   Elevated MCV   Leukocytosis   Tobacco use  1- Alcoholic hepatitis /Transaminitis/hyperbilirubinemia/leukocytosis- hepatitis panel negative.gi eval appreciated.ct abdomen no stones/blockage.daily labs.refused hiv test.  Patient was started on Cipro and Flagyl yesterday for possible SBP with her WBC is trending down today.  Her liver function tests are still pending.  She has not had her paracentesis done yet.  She reported allergies to possibly to ciprofloxacin but has not had any issues after receiving 1 dose last night.  Therefore I will continue Cipro and Flagyl. 2-moderate protein calorie malnutrition 1 unit of albumin ordered. 3-Alcohol abuse on ciwa.  Stable with no evidence of withdrawal.  She has not had any alcohol in the last 10 days. 4- TObacoo abuse 6- Hyponatyremia/Hypokalemia improved discontinued ivf.she has peritoneal fluid by ct and exam-ascites.monitor.  IR to do paracentesis and send the fluid for studies as ordered. 7-Constipation we will place her on daily stool softeners. DVT prophylaxis: scd Code Status: Code Family Communication: No no family at bedside Disposition Plan:  To be determined  Consultants: GI Dr. Bosie ClosScHOOLER  Procedures:   Antimicrobials: Cipro and Flagyl   Subjective: Complains of not having a BM for over 7 days Objective: Vitals:   02/14/17 2128 02/15/17 0549 02/15/17 0600 02/15/17 0610  BP: 117/76 (!) 152/128 110/70 112/68  Pulse: (!) 102 93    Resp: 20 18    Temp: 98 F (36.7 C) 98.1 F (36.7 C)    TempSrc: Oral Oral  SpO2: 93% 93%    Weight:      Height:        Intake/Output Summary (Last 24 hours) at 02/15/17 0948 Last data filed at 02/14/17 2100  Gross per 24 hour  Intake              120 ml  Output                0 ml    Net              120 ml   Filed Weights   02/11/17 1454 02/11/17 2143  Weight: 65.8 kg (145 lb) 71.4 kg (157 lb 8 oz)    Examination:  General exam: Appears calm and comfortable  Respiratory system: Clear to auscultation. Respiratory effort normal. Cardiovascular system: S1 & S2 heard, RRR. No JVD, murmurs, rubs, gallops or clicks. No pedal edema. Gastrointestinal system: Abdomen is distended, soft and nontender. No organomegaly or masses felt. Normal bowel sounds heard. Central nervous system: Alert and oriented. No focal neurological deficits. Extremities: Symmetric 5 x 5 power. Skin: No rashes, lesions or ulcers Psychiatry: Judgement and insight appear normal. Mood & affect appropriate.     Data Reviewed: I have personally reviewed following labs and imaging studies  CBC:  Recent Labs Lab 02/11/17 1550 02/12/17 0653 02/13/17 0702 02/14/17 0614 02/15/17 0643  WBC 23.8* 22.3* 23.5* 24.9* 20.8*  NEUTROABS 21.5* 20.5* 19.7* 20.7*  --   HGB 13.2 12.2 10.8* 12.1 11.2*  HCT 36.7 33.7* 30.4* 33.7* 31.7*  MCV 115.4* 113.9* 116.0* 114.6* 114.9*  PLT 290 292 305 305 278   Basic Metabolic Panel:  Recent Labs Lab 02/11/17 1550 02/12/17 0653 02/13/17 0702 02/14/17 0614  NA 131* 134* 134* 133*  K 3.4* 3.7 3.8 3.7  CL 95* 103 104 102  CO2 22 21* 20* 20*  GLUCOSE 115* 153* 108* 99  BUN 5* <5* 7 9  CREATININE <0.30* <0.30* <0.30* <0.30*  CALCIUM 9.1 8.7* 8.4* 9.0  MG 1.7  --   --   --   PHOS 3.3  --   --   --    GFR: CrCl cannot be calculated (This lab value cannot be used to calculate CrCl because it is not a number: <0.30). Liver Function Tests:  Recent Labs Lab 02/11/17 1550 02/12/17 0653 02/13/17 0702 02/14/17 0614  AST 194* 170* 201* 257*  ALT 51 48 47 63*  ALKPHOS 304* 268* 248* 288*  BILITOT 26.0* 24.8* 21.5* 25.0*  PROT 7.8 7.1 6.2* 7.1  ALBUMIN 2.6* 2.4* 2.1* 2.3*    Recent Labs Lab 02/11/17 1550 02/12/17 0653  LIPASE 59* 45    Recent  Labs Lab 02/11/17 2016  AMMONIA 35   Coagulation Profile:  Recent Labs Lab 02/11/17 1842  INR 1.59   Cardiac Enzymes: No results for input(s): CKTOTAL, CKMB, CKMBINDEX, TROPONINI in the last 168 hours. BNP (last 3 results) No results for input(s): PROBNP in the last 8760 hours. HbA1C: No results for input(s): HGBA1C in the last 72 hours. CBG: No results for input(s): GLUCAP in the last 168 hours. Lipid Profile: No results for input(s): CHOL, HDL, LDLCALC, TRIG, CHOLHDL, LDLDIRECT in the last 72 hours. Thyroid Function Tests: No results for input(s): TSH, T4TOTAL, FREET4, T3FREE, THYROIDAB in the last 72 hours. Anemia Panel: No results for input(s): VITAMINB12, FOLATE, FERRITIN, TIBC, IRON, RETICCTPCT in the last 72 hours. Sepsis Labs: No results for input(s): PROCALCITON, LATICACIDVEN in the last 168 hours.  No results found  for this or any previous visit (from the past 240 hour(s)).       Radiology Studies: Dg Chest 1 View  Result Date: 02/13/2017 CLINICAL DATA:  Feeling unwell for 10 days. Decreased appetite. Jaundice. History of alcohol abuse. EXAM: CHEST 1 VIEW COMPARISON:  None. FINDINGS: Cardiomediastinal silhouette is normal. Bronchitic changes. Strandy densities LEFT lung base with blunted LEFT costophrenic angle. No mediastinal shift or pneumothorax. Soft tissue planes and included osseous structures are nonsuspicious. IMPRESSION: Mild bronchitic changes. LEFT lung base atelectasis/ scarring with small LEFT pleural effusion. Electronically Signed   By: Awilda Metro M.D.   On: 02/13/2017 19:37   Dg Abd 1 View  Result Date: 02/13/2017 CLINICAL DATA:  Pain. EXAM: ABDOMEN - 1 VIEW COMPARISON:  CT abdomen pelvis dated February 11, 2017. FINDINGS: The bowel gas pattern is normal. No radio-opaque calculi or other significant radiographic abnormality are seen. IMPRESSION: Negative. Electronically Signed   By: Obie Dredge M.D.   On: 02/13/2017 19:36         Scheduled Meds: . feeding supplement  1 Container Oral TID BM  . folic acid  1 mg Oral Daily  . lactulose  30 g Oral Once  . LORazepam  0-4 mg Oral Q12H  . magnesium oxide  200 mg Oral Daily  . multivitamin with minerals  1 tablet Oral Daily  . pantoprazole  40 mg Oral Daily  . pentoxifylline  400 mg Oral TID WC  . thiamine  100 mg Oral Daily   Continuous Infusions: . ciprofloxacin Stopped (02/15/17 0720)  . metronidazole 500 mg (02/15/17 0228)     LOS: 4 days      Alwyn Ren, MD [facility]  If 7PM-7AM, please contact night-coverage [email or web address] Password [email or web address] 02/15/2017, 9:48 AM

## 2017-02-16 LAB — COMPREHENSIVE METABOLIC PANEL
ALBUMIN: 2.1 g/dL — AB (ref 3.5–5.0)
ALT: 54 U/L (ref 14–54)
ANION GAP: 11 (ref 5–15)
AST: 183 U/L — ABNORMAL HIGH (ref 15–41)
Alkaline Phosphatase: 219 U/L — ABNORMAL HIGH (ref 38–126)
BILIRUBIN TOTAL: 24.2 mg/dL — AB (ref 0.3–1.2)
BUN: 9 mg/dL (ref 6–20)
CO2: 19 mmol/L — ABNORMAL LOW (ref 22–32)
Calcium: 8.7 mg/dL — ABNORMAL LOW (ref 8.9–10.3)
Chloride: 105 mmol/L (ref 101–111)
Creatinine, Ser: 0.33 mg/dL — ABNORMAL LOW (ref 0.44–1.00)
GFR calc non Af Amer: >60 mL/min (ref >60)
Glucose, Bld: 99 mg/dL (ref 65–99)
POTASSIUM: 3.5 mmol/L (ref 3.5–5.1)
SODIUM: 135 mmol/L (ref 135–145)
TOTAL PROTEIN: 6.1 g/dL — AB (ref 6.5–8.1)

## 2017-02-16 LAB — CBC
HEMATOCRIT: 29.9 % — AB (ref 36.0–46.0)
Hemoglobin: 10.8 g/dL — ABNORMAL LOW (ref 12.0–15.0)
MCH: 41.1 pg — ABNORMAL HIGH (ref 26.0–34.0)
MCHC: 36.1 g/dL — AB (ref 30.0–36.0)
MCV: 113.7 fL — ABNORMAL HIGH (ref 78.0–100.0)
Platelets: 270 10*3/uL (ref 150–400)
RBC: 2.63 MIL/uL — ABNORMAL LOW (ref 3.87–5.11)
RDW: 16.4 % — AB (ref 11.5–15.5)
WBC: 21.6 10*3/uL — ABNORMAL HIGH (ref 4.0–10.5)

## 2017-02-16 LAB — GRAM STAIN

## 2017-02-16 LAB — AMMONIA: AMMONIA: 58 umol/L — AB (ref 9–35)

## 2017-02-16 NOTE — Progress Notes (Signed)
Feels "80% better" following yesterday's 3L paracentesis, fluid had very low ANC suggesting no SBP.  Labs improved:  Transaminases coming down--WBC still elevated, TBili still very high but stable.  Ammonia level is slightly high, but pt is very alert, can do serial 4's (1 error)  but can't quite do serial 7's.  "February 16, 2017"  Abd is still slt protruberant (?adiposity--apparently was drained dry)--flank tympany present c/w absence of ascites.  IMPR:  1.  Marked improvement in abd discomfort following large-vol paracentesis 2.  No evid of SBP 3.  Gradually improving alcoholic hepatitis 4.  Minimal, if any, encephalopathy on lactulose   RECOMM:  1. Continue observation on low-dose diuretics--monitor renal fn and fluid status (have ordered daily weights) 2. Consult to dietitian on low sodium diet (ordered) 3. Consider d/c of antbx 4. Would arrange substance-abuse counselling  Megan [name], M.D. Pager [phone] If no answer or after 5 PM call [phone]

## 2017-02-16 NOTE — Progress Notes (Signed)
PROGRESS NOTE    Megan [name]  RUE:[id number] DOB: 1966/04/29 DOA: 02/11/2017 PCP: Patient, No Pcp Per  Brief Narrative:51 y.o.femalewith medical history significant of alcohol abuse, anxiety, hypertension, tobacco use disorder who is coming to the emergency department with complaints of not feeling well for the past 10 days, when she first began having nausea, decreased appetite, followed by jaundice, dark color urine, decreased urinary volume and abdominal distention for the past 2 days. She mentions that she has been drinking several shots of whiskey every night for the past 10 years. She denies drinking in the past 6 days, withdrawal symptoms or previous detox admission. She denies hepatitis history or exposure. She denies HIV exposure to her knowledge, and declined screening tests. No fever, but positive chills and fatigue. She denies headaches, earaches, sore throat, productive cough with dyspnea. However, she mentions having a recent nosebleed several days ago and bruising easily. No chest pain, dizziness, diaphoresis, orthopnea, PND or pitting edema of the lower extremities. Denies dysuria, frequency or hematuria, but states that her urinary volume has been less than usual. No polyuria, blurred vision, polyphagia or polydipsia.  ED Course:initial vital signs temperature 99.72F, pulse 103, respirations 16, blood pressure 133/82 mmHg and O2 sat 98% on room air. She received 1 L normal saline bolus in the emergency department.  Her workup shows a urine analysis showing mild glucosuria, small hemoglobinuria, moderate bilirubin and mild proteinuria. Microscopic shows many bacteria. WBC was 23.8, hemoglobin 13.2 g/dL and platelets 147. PT 18.8 seconds and INR 1.59. Sodium 131, potassium 3.4, chloride 95 and bicarbonate 22 mmol/L. BUN was 5, creatinine less than 0.30 and glucose 115 mg/dL.her albumin was 2.6 g/dL, total bilirubin 26 mg/dL, AST 829 and alkaline phosphatase 304 units. She had a bm  yesterday.  Patient had paracentesis done remotely 3.1 L of clear yellow fluid from her belly.  She reports that she is feeling a lot better after the fluid was taken out.  Appreciate GI input.fluid studies noted. Assessment & Plan:   Principal Problem:   Acute alcoholic hepatitis Active Problems:   Hypertension   Hypokalemia   Heart murmur   Anxiety   Elevated MCV   Leukocytosis   Tobacco use Alcoholic hepatitis-it appears that her total bilirubin has plateaued.  Paracentesis fluid does not reveal any evidence of infection.  Will DC Cipro and Flagyl at this time.  Mildly elevated ammonia patient refusing lactulose.  For the last 2-3 days.  Started on Aldactone 50 mg twice a day.  Monitor liver function test daily.  Hyponatremia improved  Hypokalemia repleted  Protein calorie malnutrition status post albumin  DVT prophylaxis: SCD Code Status: Full code  family Communication: No family available at this time Disposition Plan:tbd  Consultants:  gi  Procedures: paracentesis Antimicrobials:none  Subjective: Feels better.  Objective: Vitals:   02/15/17 1620 02/15/17 2101 02/15/17 2300 02/16/17 0538  BP: 107/75 (!) 91/46 (!) 94/51 (!) 99/57  Pulse:  98 (!) 102 96  Resp:  18 16 16   Temp:  98 F (36.7 C) 98.4 F (36.9 C) 98.4 F (36.9 C)  TempSrc:  Oral Oral Oral  SpO2:  95% 96% 94%  Weight:      Height:        Intake/Output Summary (Last 24 hours) at 02/16/17 1355 Last data filed at 02/16/17 0745  Gross per 24 hour  Intake              250 ml  Output  0 ml  Net              250 ml   Filed Weights   02/11/17 1454 02/11/17 2143  Weight: 65.8 kg (145 lb) 71.4 kg (157 lb 8 oz)    Examination:  General exam: Appears calm and comfortable  Respiratory system: Clear to auscultation. Respiratory effort normal. Cardiovascular system: S1 & S2 heard, RRR. No JVD, murmurs, rubs, gallops or clicks. No pedal edema. Gastrointestinal system: Abdomen is  nondistended, soft and nontender. No organomegaly or masses felt. Normal bowel sounds heard. Central nervous system: Alert and oriented. No focal neurological deficits. Extremities: Symmetric 5 x 5 power. Skin: No rashes, lesions or ulcers Psychiatry: Judgement and insight appear normal. Mood & affect appropriate.     Data Reviewed: I have personally reviewed following labs and imaging studies  CBC:  Recent Labs Lab 02/11/17 1550 02/12/17 0653 02/13/17 0702 02/14/17 0614 02/15/17 0643 02/16/17 0623  WBC 23.8* 22.3* 23.5* 24.9* 20.8* 21.6*  NEUTROABS 21.5* 20.5* 19.7* 20.7*  --   --   HGB 13.2 12.2 10.8* 12.1 11.2* 10.8*  HCT 36.7 33.7* 30.4* 33.7* 31.7* 29.9*  MCV 115.4* 113.9* 116.0* 114.6* 114.9* 113.7*  PLT 290 292 305 305 278 270   Basic Metabolic Panel:  Recent Labs Lab 02/11/17 1550  02/13/17 0702 02/14/17 0614 02/15/17 0643 02/15/17 1131 02/16/17 0623  NA 131*  < > 134* 133* 133* 134* 135  K 3.4*  < > 3.8 3.7 3.6 3.6 3.5  CL 95*  < > 104 102 101 102 105  CO2 22  < > 20* 20* 17* 19* 19*  GLUCOSE 115*  < > 108* 99 109* 93 99  BUN 5*  < > 7 9 8 9 9   CREATININE <0.30*  < > <0.30* <0.30* <0.30* <0.30* 0.33*  CALCIUM 9.1  < > 8.4* 9.0 8.7* 9.1 8.7*  MG 1.7  --   --   --   --   --   --   PHOS 3.3  --   --   --   --   --   --   < > = values in this interval not displayed. GFR: Estimated Creatinine Clearance: 83.4 mL/min (A) (by C-G formula based on SCr of 0.33 mg/dL (L)). Liver Function Tests:  Recent Labs Lab 02/13/17 0702 02/14/17 0614 02/15/17 0643 02/15/17 1131 02/16/17 0623  AST 201* 257* 197* 209* 183*  ALT 47 63* 53 62* 54  ALKPHOS 248* 288* 245* 261* 219*  BILITOT 21.5* 25.0* 24.5* 26.9* 24.2*  PROT 6.2* 7.1 6.4* 6.9 6.1*  ALBUMIN 2.1* 2.3* 2.4* 2.5* 2.1*    Recent Labs Lab 02/11/17 1550 02/12/17 0653  LIPASE 59* 45    Recent Labs Lab 02/11/17 2016 02/16/17 0623  AMMONIA 35 58*   Coagulation Profile:  Recent Labs Lab  02/11/17 1842  INR 1.59   Cardiac Enzymes: No results for input(s): CKTOTAL, CKMB, CKMBINDEX, TROPONINI in the last 168 hours. BNP (last 3 results) No results for input(s): PROBNP in the last 8760 hours. HbA1C: No results for input(s): HGBA1C in the last 72 hours. CBG: No results for input(s): GLUCAP in the last 168 hours. Lipid Profile: No results for input(s): CHOL, HDL, LDLCALC, TRIG, CHOLHDL, LDLDIRECT in the last 72 hours. Thyroid Function Tests: No results for input(s): TSH, T4TOTAL, FREET4, T3FREE, THYROIDAB in the last 72 hours. Anemia Panel: No results for input(s): VITAMINB12, FOLATE, FERRITIN, TIBC, IRON, RETICCTPCT in the last 72 hours. Sepsis Labs: No  results for input(s): PROCALCITON, LATICACIDVEN in the last 168 hours.  No results found for this or any previous visit (from the past 240 hour(s)).       Radiology Studies: US Paracentesis  Result Date: 02/16/2017 INDICATION: Acute alcoholic hepatitis. Ascites. Request diagnostic and therapeutic paracentesis. EXAM: ULTRASOUND GUIDED LEFT LOWER QUADRANT PARACENTESIS MEDICATIONS: None. COMPLICATIONS: None immediate. PROCEDURE: Informed written consent was obtained from the patient after a discussion of the risks, benefits and alternatives to treatment. A timeout was performed prior to the initiation of the procedure. Initial ultrasound scanning demonstrates a large amount of ascites within the left lower abdominal quadrant. The left lower abdomen was prepped and draped in the usual sterile fashion. 1% lidocaine with epinephrine was used for local anesthesia. Following this, a Safe-T-Centesis catheter was introduced. An ultrasound image was saved for documentation purposes. The paracentesis was performed. The catheter was removed and a dressing was applied. The patient tolerated the procedure well without immediate post procedural complication. FINDINGS: A total of approximately 3.1 L of clear, bright yellow fluid was removed.  Samples were sent to the laboratory as requested by the clinical team. IMPRESSION: Successful ultrasound-guided paracentesis yielding 3.1 liters of peritoneal fluid. Read by: Brayton El PA-C Electronically Signed   By: Judie Petit.  Shick M.D.   On: 02/15/2017 16:26        Scheduled Meds: . bisacodyl  10 mg Oral Daily  . bisacodyl  10 mg Rectal Once  . feeding supplement  1 Container Oral TID BM  . folic acid  1 mg Oral Daily  . lactulose  30 g Oral Once  . magnesium oxide  200 mg Oral Daily  . multivitamin with minerals  1 tablet Oral Daily  . pantoprazole  40 mg Oral Daily  . pentoxifylline  400 mg Oral TID WC  . polyethylene glycol  17 g Oral Daily  . spironolactone  50 mg Oral BID  . thiamine  100 mg Oral Daily   Continuous Infusions:   LOS: 5 days     Alwyn Ren, MD [facility] If 7PM-7AM, please contact night-coverage [email or web address] Password [email or web address] 02/16/2017, 1:55 PM

## 2017-02-17 LAB — CBC
HCT: 31.2 % — ABNORMAL LOW (ref 36.0–46.0)
Hemoglobin: 11.4 g/dL — ABNORMAL LOW (ref 12.0–15.0)
MCH: 41.2 pg — ABNORMAL HIGH (ref 26.0–34.0)
MCHC: 36.5 g/dL — ABNORMAL HIGH (ref 30.0–36.0)
MCV: 112.6 fL — ABNORMAL HIGH (ref 78.0–100.0)
Platelets: 277 10*3/uL (ref 150–400)
RBC: 2.77 MIL/uL — ABNORMAL LOW (ref 3.87–5.11)
RDW: 16.1 % — ABNORMAL HIGH (ref 11.5–15.5)
WBC: 23.2 10*3/uL — ABNORMAL HIGH (ref 4.0–10.5)

## 2017-02-17 LAB — COMPREHENSIVE METABOLIC PANEL
ALT: 53 U/L (ref 14–54)
ANION GAP: 11 (ref 5–15)
AST: 168 U/L — ABNORMAL HIGH (ref 15–41)
Albumin: 2.2 g/dL — ABNORMAL LOW (ref 3.5–5.0)
Alkaline Phosphatase: 244 U/L — ABNORMAL HIGH (ref 38–126)
BUN: 10 mg/dL (ref 6–20)
CHLORIDE: 106 mmol/L (ref 101–111)
CO2: 19 mmol/L — ABNORMAL LOW (ref 22–32)
CREATININE: 0.34 mg/dL — AB (ref 0.44–1.00)
Calcium: 9 mg/dL (ref 8.9–10.3)
Glucose, Bld: 91 mg/dL (ref 65–99)
Potassium: 3.5 mmol/L (ref 3.5–5.1)
SODIUM: 136 mmol/L (ref 135–145)
Total Bilirubin: 25.4 mg/dL (ref 0.3–1.2)
Total Protein: 6.1 g/dL — ABNORMAL LOW (ref 6.5–8.1)

## 2017-02-17 MED ORDER — ENSURE ENLIVE PO LIQD
237.0000 mL | ORAL | Status: DC
  Administered 2017-02-17: 237 mL via ORAL

## 2017-02-17 MED ORDER — PRO-STAT SUGAR FREE PO LIQD
30.0000 mL | Freq: Two times a day (BID) | ORAL | Status: DC
  Administered 2017-02-17: 30 mL via ORAL
  Filled 2017-02-17: qty 30

## 2017-02-17 MED ORDER — FUROSEMIDE 40 MG PO TABS
40.0000 mg | ORAL_TABLET | Freq: Every day | ORAL | Status: DC
  Administered 2017-02-17 – 2017-02-18 (×2): 40 mg via ORAL
  Filled 2017-02-17 (×2): qty 1

## 2017-02-17 NOTE — Progress Notes (Signed)
Patient states she was not able to tolerate ensure and does not want to take anymore.

## 2017-02-17 NOTE — Progress Notes (Addendum)
Nutrition Follow-up  DOCUMENTATION CODES:   Not applicable  INTERVENTION:    30 ml Prostat BID, each supplement provides 100 kcals and 15 grams protein.   Provide MVI daily  RD provided low-sodium nutrition therapy education   NUTRITION DIAGNOSIS:   Inadequate oral intake related to acute illness (early satiety, nausea, poor PO intake) as evidenced by energy intake < or equal to 50% for > or equal to 5 days.  Ongoing  GOAL:   Patient will meet greater than or equal to 90% of their needs  Not Meeting  MONITOR:   PO intake, Supplement acceptance, Diet advancement, Labs, Weight trends  REASON FOR ASSESSMENT:   Consult Assessment of nutrition requirement/status  ASSESSMENT:   Pt is a 51 year old female presenting with abdominal distention, nausea, and jaundice. Pt has acute alcoholic hepatitis. PMH of alcohol abuse, anxiety, and HTN.   Pt's appetite still decreased due to feeling of fullness and dislike of food options. Meal completions charted as 5% average for the last 6 meals. Pt does not like Boost Breeze. Will attempt to give Prostat.   Wt noted to be increased by 14 lb since last RD visit. Suspect this is from fluid accumulation in abdomen. Pt had 3.1L drained 10/20 during paracentesis.   RD provided "Low Sodium Nutrition Therapy" handout from the [employer]. Reviewed patient's dietary recall. Provided examples on ways to decrease sodium intake in diet. Discouraged intake of processed foods and use of salt shaker. Encouraged fresh fruits and vegetables as well as whole grain sources of carbohydrates to maximize fiber intake. RD discussed why it is important for patient to adhere to diet recommendations, and emphasized the role of fluids, foods to avoid, and importance of weighing self daily. Teach back method used.  Expect fair compliance.  Medications reviewed and include: folic acid, lactulose, mag oxide, MIV with minerals, thiamine Labs  reviewed: ALP 244 (H) AST 168 (H) Ammonia 58 (H)   Diet Order:  Diet 2 gram sodium Room service appropriate? Yes; Fluid consistency: Thin  Skin:  Reviewed, no issues  Last BM:  02/17/17  Height:   Ht Readings from Last 1 Encounters:  02/11/17 5\' 5"  (1.651 m)    Weight:   Wt Readings from Last 1 Encounters:  02/17/17 159 lb (72.1 kg)    Ideal Body Weight:  56.8 kg  BMI:  Body mass index is 26.46 kg/m.  Estimated Nutritional Needs:   Kcal:  1900-2100 kcals  Protein:  90-100 grams protein  Fluid:  >/= 2 L  EDUCATION NEEDS:   No education needs identified at this time  Vanessa Kickarly Maccoy Haubner RD, LDN Clinical Nutrition Pager # - [phone]

## 2017-02-17 NOTE — Progress Notes (Signed)
Subjective: The patient was seen and examined on bedside. She complains of recurrence of abdominal distention, she has had several bowel movements(6 yesterday) and states her urine output is only once a day.  Objective: Vital signs in last 24 hours: Temp:  [97.9 F (36.6 C)-98.3 F (36.8 C)] 98.1 F (36.7 C) (10/22 0440) Pulse Rate:  [96-106] 96 (10/22 0440) Resp:  [18-20] 20 (10/22 0440) BP: (91-108)/(51-67) 104/66 (10/22 0440) SpO2:  [94 %-96 %] 95 % (10/22 0440) Weight:  [72.1 kg (159 lb)] 72.1 kg (159 lb) (10/22 0440) Weight change:  Last BM Date: 02/17/17  PE: Deep icterus, mild pallor GENERAL: Oriented 3, no asterixis ABDOMEN: Distended but not tense, presence of shifting dullness without fluid thrill, normoactive bowel sounds EXTREMITIES: No edema  Lab Results: Results for orders placed or performed during the hospital encounter of 02/11/17 (from the past 48 hour(s))  Comprehensive metabolic panel     Status: Abnormal   Collection Time: 02/15/17 11:31 AM  Result Value Ref Range   Sodium 134 (L) 135 - 145 mmol/L   Potassium 3.6 3.5 - 5.1 mmol/L   Chloride 102 101 - 111 mmol/L   CO2 19 (L) 22 - 32 mmol/L   Glucose, Bld 93 65 - 99 mg/dL   BUN 9 6 - 20 mg/dL   Creatinine, Ser <0.30 (L) 0.44 - 1.00 mg/dL   Calcium 9.1 8.9 - 10.3 mg/dL   Total Protein 6.9 6.5 - 8.1 g/dL   Albumin 2.5 (L) 3.5 - 5.0 g/dL   AST 209 (H) 15 - 41 U/L   ALT 62 (H) 14 - 54 U/L   Alkaline Phosphatase 261 (H) 38 - 126 U/L   Total Bilirubin 26.9 (HH) 0.3 - 1.2 mg/dL    Comment: CRITICAL RESULT CALLED TO, READ BACK BY AND VERIFIED WITH: WILLARD,W AT 12:40PM ON 02/15/17 BY FESTERMAN,C    GFR calc non Af Amer NOT CALCULATED >60 mL/min   GFR calc Af Amer NOT CALCULATED >60 mL/min    Comment: (NOTE) The eGFR has been calculated using the CKD EPI equation. This calculation has not been validated in all clinical situations. eGFR's persistently <60 mL/min signify possible Chronic Kidney Disease.     Anion gap 13 5 - 15  Lactate dehydrogenase (pleural or peritoneal fluid)     Status: Abnormal   Collection Time: 02/15/17  4:27 PM  Result Value Ref Range   LD, Fluid 36 (H) 3 - 23 U/L    Comment: (NOTE) Results should be evaluated in conjunction with serum values Performed at [facility] [address] [place], [place] [address]    Fluid Type-FLDH PERITONEAL     Comment: CORRECTED ON 10/20 AT 1704: PREVIOUSLY REPORTED AS Peritoneal  Body fluid cell count with differential     Status: Abnormal   Collection Time: 02/15/17  4:27 PM  Result Value Ref Range   Fluid Type-FCT PERITONEAL     Comment: CORRECTED ON 10/20 AT 1704: PREVIOUSLY REPORTED AS Peritoneal   Color, Fluid YELLOW YELLOW   Appearance, Fluid CLEAR CLEAR   WBC, Fluid 70 0 - 1,000 cu mm   Neutrophil Count, Fluid 23 0 - 25 %   Lymphs, Fluid 52 %   Monocyte-Macrophage-Serous Fluid 25 (L) 50 - 90 %   Eos, Fluid 0 %   Other Cells, Fluid 1 NRBC %  Protein, pleural or peritoneal fluid     Status: None   Collection Time: 02/15/17  4:27 PM  Result Value Ref Range  Total protein, fluid <3.0 g/dL    Comment: (NOTE) No normal range established for this test Results should be evaluated in conjunction with serum values Performed at [facility], [address] [place], [place] [address]    Fluid Type-FTP PERITONEAL     Comment: CORRECTED ON 10/20 AT 1704: PREVIOUSLY REPORTED AS Peritoneal  Glucose, pleural or peritoneal fluid     Status: None   Collection Time: 02/15/17  4:27 PM  Result Value Ref Range   Glucose, Fluid 99 mg/dL    Comment: (NOTE) No normal range established for this test Results should be evaluated in conjunction with serum values Performed at [facility] [address], [place], [place] [address]    Fluid Type-FGLU PERITONEAL     Comment: CORRECTED ON 10/20 AT 1704: PREVIOUSLY REPORTED AS Peritoneal  Culture, body fluid-bottle     Status: None (Preliminary result)    Collection Time: 02/15/17  4:27 PM  Result Value Ref Range   Specimen Description PLEURAL    Special Requests NONE    Culture      NO GROWTH < 24 HOURS Performed at [facility], [place] [address], [place], [place] [address]    Report Status PENDING   Gram stain     Status: None   Collection Time: 02/15/17  4:27 PM  Result Value Ref Range   Specimen Description PLEURAL    Special Requests NONE    Gram Stain      RARE WBC PRESENT,BOTH PMN AND MONONUCLEAR NO ORGANISMS SEEN Performed at [facility], [place] [address], [place],  [address]    Report Status 02/16/2017 FINAL   CBC     Status: Abnormal   Collection Time: 02/16/17  6:23 AM  Result Value Ref Range   WBC 21.6 (H) 4.0 - 10.5 K/uL   RBC 2.63 (L) 3.87 - 5.11 MIL/uL   Hemoglobin 10.8 (L) 12.0 - 15.0 g/dL   HCT 29.9 (L) 36.0 - 46.0 %   MCV 113.7 (H) 78.0 - 100.0 fL   MCH 41.1 (H) 26.0 - 34.0 pg   MCHC 36.1 (H) 30.0 - 36.0 g/dL   RDW 16.4 (H) 11.5 - 15.5 %   Platelets 270 150 - 400 K/uL  Comprehensive metabolic panel     Status: Abnormal   Collection Time: 02/16/17  6:23 AM  Result Value Ref Range   Sodium 135 135 - 145 mmol/L   Potassium 3.5 3.5 - 5.1 mmol/L   Chloride 105 101 - 111 mmol/L   CO2 19 (L) 22 - 32 mmol/L   Glucose, Bld 99 65 - 99 mg/dL   BUN 9 6 - 20 mg/dL   Creatinine, Ser 0.33 (L) 0.44 - 1.00 mg/dL   Calcium 8.7 (L) 8.9 - 10.3 mg/dL   Total Protein 6.1 (L) 6.5 - 8.1 g/dL   Albumin 2.1 (L) 3.5 - 5.0 g/dL   AST 183 (H) 15 - 41 U/L   ALT 54 14 - 54 U/L   Alkaline Phosphatase 219 (H) 38 - 126 U/L   Total Bilirubin 24.2 (HH) 0.3 - 1.2 mg/dL    Comment: CRITICAL VALUE NOTED.  VALUE IS CONSISTENT WITH PREVIOUSLY REPORTED AND CALLED VALUE.   GFR calc non Af Amer >60 >60 mL/min   GFR calc Af Amer >60 >60 mL/min    Comment: (NOTE) The eGFR has been calculated using the CKD EPI equation. This calculation has not been validated in all clinical situations. eGFR's persistently <  60 mL/min signify  possible Chronic Kidney Disease.    Anion gap 11 5 - 15  Ammonia     Status: Abnormal   Collection Time: 02/16/17  6:23 AM  Result Value Ref Range   Ammonia 58 (H) 9 - 35 umol/L  Comprehensive metabolic panel     Status: Abnormal   Collection Time: 02/17/17  7:42 AM  Result Value Ref Range   Sodium 136 135 - 145 mmol/L   Potassium 3.5 3.5 - 5.1 mmol/L   Chloride 106 101 - 111 mmol/L   CO2 19 (L) 22 - 32 mmol/L   Glucose, Bld 91 65 - 99 mg/dL   BUN 10 6 - 20 mg/dL   Creatinine, Ser 0.34 (L) 0.44 - 1.00 mg/dL   Calcium 9.0 8.9 - 10.3 mg/dL   Total Protein 6.1 (L) 6.5 - 8.1 g/dL   Albumin 2.2 (L) 3.5 - 5.0 g/dL   AST 168 (H) 15 - 41 U/L   ALT 53 14 - 54 U/L   Alkaline Phosphatase 244 (H) 38 - 126 U/L   Total Bilirubin 25.4 (HH) 0.3 - 1.2 mg/dL    Comment: CRITICAL VALUE NOTED.  VALUE IS CONSISTENT WITH PREVIOUSLY REPORTED AND CALLED VALUE.   GFR calc non Af Amer >60 >60 mL/min   GFR calc Af Amer >60 >60 mL/min    Comment: (NOTE) The eGFR has been calculated using the CKD EPI equation. This calculation has not been validated in all clinical situations. eGFR's persistently <60 mL/min signify possible Chronic Kidney Disease.    Anion gap 11 5 - 15  CBC     Status: Abnormal   Collection Time: 02/17/17  7:42 AM  Result Value Ref Range   WBC 23.2 (H) 4.0 - 10.5 K/uL   RBC 2.77 (L) 3.87 - 5.11 MIL/uL   Hemoglobin 11.4 (L) 12.0 - 15.0 g/dL   HCT 31.2 (L) 36.0 - 46.0 %   MCV 112.6 (H) 78.0 - 100.0 fL   MCH 41.2 (H) 26.0 - 34.0 pg   MCHC 36.5 (H) 30.0 - 36.0 g/dL   RDW 16.1 (H) 11.5 - 15.5 %   Platelets 277 150 - 400 K/uL    Studies/Results: US Paracentesis  Result Date: 02/16/2017 INDICATION: Acute alcoholic hepatitis. Ascites. Request diagnostic and therapeutic paracentesis. EXAM: ULTRASOUND GUIDED LEFT LOWER QUADRANT PARACENTESIS MEDICATIONS: None. COMPLICATIONS: None immediate. PROCEDURE: Informed written consent was obtained from the patient after a discussion of the  risks, benefits and alternatives to treatment. A timeout was performed prior to the initiation of the procedure. Initial ultrasound scanning demonstrates a large amount of ascites within the left lower abdominal quadrant. The left lower abdomen was prepped and draped in the usual sterile fashion. 1% lidocaine with epinephrine was used for local anesthesia. Following this, a Safe-T-Centesis catheter was introduced. An ultrasound image was saved for documentation purposes. The paracentesis was performed. The catheter was removed and a dressing was applied. The patient tolerated the procedure well without immediate post procedural complication. FINDINGS: A total of approximately 3.1 L of clear, bright yellow fluid was removed. Samples were sent to the laboratory as requested by the clinical team. IMPRESSION: Successful ultrasound-guided paracentesis yielding 3.1 liters of peritoneal fluid. Read by: Ascencion Dike PA-C Electronically Signed   By: Jerilynn Mages.  Shick M.D.   On: 02/15/2017 16:26    Medications: I have reviewed the patient's current medications.  Assessment: 1. Alcoholic hepatitis, ascites status post paracentesis, no SBP 2. Macrocytic anemia   Plan: 1. Continue Trental  400 mg 3 times a day 2. Patient is on spironolactone 100 mg daily, will add Lasix 40 mg daily 3. Protein calorie malnutrition-started on pro-stat 30 mL twice a day, continue folic acid 1 mg daily, multivitamin 1 tablet daily, Timentin 100 mg daily. 4. Monitor for alcohol withdrawal 5. Replace potassium and magnesium as needed. 6. Received 1 dose of lactulose yesterday, will discontinue Dulcolax tablet.   Ronnette Juniper 02/17/2017, 11:23 AM   Pager [phone] If no answer or after 5 PM call [phone]

## 2017-02-17 NOTE — Progress Notes (Addendum)
PROGRESS NOTE    Megan [name]  ZOX:[id number] DOB: 1965/10/24 DOA: 02/11/2017 PCP: Patient, No Pcp Per  Brief Narrative::51 y.o.femalewith medical history significant of alcohol abuse, anxiety, hypertension, tobacco use disorder who is coming to the emergency department with complaints of not feeling well for the past 10 days, when she first began having nausea, decreased appetite, followed by jaundice, dark color urine, decreased urinary volume and abdominal distention for the past 2 days. She mentions that she has been drinking several shots of whiskey every night for the past 10 years. She denies drinking in the past 6 days, withdrawal symptoms or previous detox admission. She denies hepatitis history or exposure. She denies HIV exposure to her knowledge, and declined screening tests. No fever, but positive chills and fatigue. She denies headaches, earaches, sore throat, productive cough with dyspnea. However, she mentions having a recent nosebleed several days ago and bruising easily. No chest pain, dizziness, diaphoresis, orthopnea, PND or pitting edema of the lower extremities. Denies dysuria, frequency or hematuria, but states that her urinary volume has been less than usual. No polyuria, blurred vision, polyphagia or polydipsia.  ED Course:initial vital signs temperature 99.59F, pulse 103, respirations 16, blood pressure 133/82 mmHg and O2 sat 98% on room air. She received 1 L normal saline bolus in the emergency department.  Her workup shows a urine analysis showing mild glucosuria, small hemoglobinuria, moderate bilirubin and mild proteinuria. Microscopic shows many bacteria. WBC was 23.8, hemoglobin 13.2 g/dL and platelets 811. PT 18.8 seconds and INR 1.59. Sodium 131, potassium 3.4, chloride 95 and bicarbonate 22 mmol/L. BUN was 5, creatinine less than 0.30 and glucose 115 mg/dL.her albumin was 2.6 g/dL, total bilirubin 26 mg/dL, AST 914 and alkaline phosphatase 304 units. She had a bm  yesterday.  Patient had paracentesis done 3.1 L of clear yellow fluid from her belly.  She reports that she is feeling a lot better after the fluid was taken out.  Appreciate GI input.fluid studies noted. Assessment & Plan:    Principal Problem:   Acute alcoholic hepatitis Active Problems:   Hypertension   Hypokalemia   Heart murmur   Anxiety   Elevated MCV   Leukocytosis   Tobacco use Alcoholic hepatitis-it appears that her total bilirubin has plateaued.  Paracentesis fluid does not reveal any evidence of infection.  Will DC Cipro and Flagyl at this time.  Mildly elevated ammonia patient refusing lactulose.  For the last 2-3 days.  Started on Aldactone 50 mg twice a day.  Monitor liver function test daily.  Hyponatremia improved  Hypokalemia repleted  Protein calorie malnutrition status post albumin  DVT prophylaxis: SCD Code Status: Full code  family Communication: No family available at this time Disposition Plan:tbd  Consultants:  gi   DVT prophylaxis: Family Communication:  Disposition Plan:.   Consultants:  Procedures:  Subjective:   Objective: Vitals:   02/16/17 0538 02/16/17 1404 02/16/17 2047 02/17/17 0440  BP: (!) 99/57 (!) 91/51 108/67 104/66  Pulse: 96 (!) 101 (!) 106 96  Resp: 16 18 20 20   Temp: 98.4 F (36.9 C) 97.9 F (36.6 C) 98.3 F (36.8 C) 98.1 F (36.7 C)  TempSrc: Oral Oral Oral Oral  SpO2: 94% 96% 94% 95%  Weight:  72.1 kg (159 lb)  72.1 kg (159 lb)  Height:        Intake/Output Summary (Last 24 hours) at 02/17/17 1121 Last data filed at 02/16/17 1700  Gross per 24 hour  Intake  220 ml  Output                0 ml  Net              220 ml   Filed Weights   02/11/17 2143 02/16/17 1404 02/17/17 0440  Weight: 71.4 kg (157 lb 8 oz) 72.1 kg (159 lb) 72.1 kg (159 lb)    Examination:  General exam: Appears calm and comfortable  Respiratory system: Clear to auscultation. Respiratory effort normal. Cardiovascular  system: S1 & S2 heard, RRR. No JVD, murmurs, rubs, gallops or clicks. No pedal edema. Gastrointestinal system: Abdomen is nondistended, soft and nontender. No organomegaly or masses felt. Normal bowel sounds heard. Central nervous system: Alert and oriented. No focal neurological deficits. Extremities: Symmetric 5 x 5 power. Skin: No rashes, lesions or ulcers Psychiatry: Judgement and insight appear normal. Mood & affect appropriate.     Data Reviewed: I have personally reviewed following labs and imaging studies  CBC:  Recent Labs Lab 02/11/17 1550 02/12/17 0653 02/13/17 0702 02/14/17 0614 02/15/17 0643 02/16/17 0623 02/17/17 0742  WBC 23.8* 22.3* 23.5* 24.9* 20.8* 21.6* 23.2*  NEUTROABS 21.5* 20.5* 19.7* 20.7*  --   --   --   HGB 13.2 12.2 10.8* 12.1 11.2* 10.8* 11.4*  HCT 36.7 33.7* 30.4* 33.7* 31.7* 29.9* 31.2*  MCV 115.4* 113.9* 116.0* 114.6* 114.9* 113.7* 112.6*  PLT 290 292 305 305 278 270 277   Basic Metabolic Panel:  Recent Labs Lab 02/11/17 1550  02/14/17 0614 02/15/17 0643 02/15/17 1131 02/16/17 0623 02/17/17 0742  NA 131*  < > 133* 133* 134* 135 136  K 3.4*  < > 3.7 3.6 3.6 3.5 3.5  CL 95*  < > 102 101 102 105 106  CO2 22  < > 20* 17* 19* 19* 19*  GLUCOSE 115*  < > 99 109* 93 99 91  BUN 5*  < > 9 8 9 9 10   CREATININE <0.30*  < > <0.30* <0.30* <0.30* 0.33* 0.34*  CALCIUM 9.1  < > 9.0 8.7* 9.1 8.7* 9.0  MG 1.7  --   --   --   --   --   --   PHOS 3.3  --   --   --   --   --   --   < > = values in this interval not displayed. GFR: Estimated Creatinine Clearance: 83.7 mL/min (A) (by C-G formula based on SCr of 0.34 mg/dL (L)). Liver Function Tests:  Recent Labs Lab 02/14/17 0614 02/15/17 0643 02/15/17 1131 02/16/17 0623 02/17/17 0742  AST 257* 197* 209* 183* 168*  ALT 63* 53 62* 54 53  ALKPHOS 288* 245* 261* 219* 244*  BILITOT 25.0* 24.5* 26.9* 24.2* 25.4*  PROT 7.1 6.4* 6.9 6.1* 6.1*  ALBUMIN 2.3* 2.4* 2.5* 2.1* 2.2*    Recent Labs Lab  02/11/17 1550 02/12/17 0653  LIPASE 59* 45    Recent Labs Lab 02/11/17 2016 02/16/17 0623  AMMONIA 35 58*   Coagulation Profile:  Recent Labs Lab 02/11/17 1842  INR 1.59   Cardiac Enzymes: No results for input(s): CKTOTAL, CKMB, CKMBINDEX, TROPONINI in the last 168 hours. BNP (last 3 results) No results for input(s): PROBNP in the last 8760 hours. HbA1C: No results for input(s): HGBA1C in the last 72 hours. CBG: No results for input(s): GLUCAP in the last 168 hours. Lipid Profile: No results for input(s): CHOL, HDL, LDLCALC, TRIG, CHOLHDL, LDLDIRECT in the last 72 hours. Thyroid Function  Tests: No results for input(s): TSH, T4TOTAL, FREET4, T3FREE, THYROIDAB in the last 72 hours. Anemia Panel: No results for input(s): VITAMINB12, FOLATE, FERRITIN, TIBC, IRON, RETICCTPCT in the last 72 hours. Sepsis Labs: No results for input(s): PROCALCITON, LATICACIDVEN in the last 168 hours.  Recent Results (from the past 240 hour(s))  Culture, body fluid-bottle     Status: None (Preliminary result)   Collection Time: 02/15/17  4:27 PM  Result Value Ref Range Status   Specimen Description PLEURAL  Final   Special Requests NONE  Final   Culture   Final    NO GROWTH < 24 HOURS Performed at [facility], [address] [place], [place] [address]    Report Status PENDING  Incomplete  Gram stain     Status: None   Collection Time: 02/15/17  4:27 PM  Result Value Ref Range Status   Specimen Description PLEURAL  Final   Special Requests NONE  Final   Gram Stain   Final    RARE WBC PRESENT,BOTH PMN AND MONONUCLEAR NO ORGANISMS SEEN Performed at [facility], [address] [place], [place] [address]    Report Status 02/16/2017 FINAL  Final         Radiology Studies: [place] Paracentesis  Result Date: 02/16/2017 INDICATION: Acute alcoholic hepatitis. Ascites. Request diagnostic and therapeutic paracentesis. EXAM: ULTRASOUND GUIDED LEFT LOWER QUADRANT PARACENTESIS  MEDICATIONS: None. COMPLICATIONS: None immediate. PROCEDURE: Informed written consent was obtained from the patient after a discussion of the risks, benefits and alternatives to treatment. A timeout was performed prior to the initiation of the procedure. Initial ultrasound scanning demonstrates a large amount of ascites within the left lower abdominal quadrant. The left lower abdomen was prepped and draped in the usual sterile fashion. 1% lidocaine with epinephrine was used for local anesthesia. Following this, a Safe-T-Centesis catheter was introduced. An ultrasound image was saved for documentation purposes. The paracentesis was performed. The catheter was removed and a dressing was applied. The patient tolerated the procedure well without immediate post procedural complication. FINDINGS: A total of approximately 3.1 L of clear, bright yellow fluid was removed. Samples were sent to the laboratory as requested by the clinical team. IMPRESSION: Successful ultrasound-guided paracentesis yielding 3.1 liters of peritoneal fluid. Read by: Brayton ElKevin Bruning PA-C Electronically Signed   By: Judie PetitM.  Shick M.D.   On: 02/15/2017 16:26        Scheduled Meds: . bisacodyl  10 mg Oral Daily  . bisacodyl  10 mg Rectal Once  . feeding supplement (PRO-STAT SUGAR FREE 64)  30 mL Oral BID  . folic acid  1 mg Oral Daily  . lactulose  30 g Oral Once  . magnesium oxide  200 mg Oral Daily  . multivitamin with minerals  1 tablet Oral Daily  . pantoprazole  40 mg Oral Daily  . pentoxifylline  400 mg Oral TID WC  . polyethylene glycol  17 g Oral Daily  . spironolactone  50 mg Oral BID  . thiamine  100 mg Oral Daily   Continuous Infusions:   LOS: 6 days     Alwyn RenElizabeth G Neco Kling, MD [facility]   If 7PM-7AM, please contact night-coverage [email or web address] Password [email or web address] 02/17/2017, 11:21 AM

## 2017-02-18 MED ORDER — TRAMADOL HCL 50 MG PO TABS: 50.0000 mg | ORAL_TABLET | Freq: Four times a day (QID) | ORAL | 0 refills | Status: AC | PRN

## 2017-02-18 MED ORDER — THIAMINE HCL 100 MG PO TABS: 100.0000 mg | ORAL_TABLET | Freq: Every day | ORAL | Status: AC

## 2017-02-18 MED ORDER — ALPRAZOLAM 0.5 MG PO TABS: 0.5000 mg | ORAL_TABLET | Freq: Two times a day (BID) | ORAL | 0 refills | Status: AC | PRN

## 2017-02-18 MED ORDER — PANTOPRAZOLE SODIUM 40 MG PO TBEC: 40.0000 mg | DELAYED_RELEASE_TABLET | Freq: Every day | ORAL | 0 refills | Status: AC

## 2017-02-18 MED ORDER — FUROSEMIDE 40 MG PO TABS: 40.0000 mg | ORAL_TABLET | Freq: Every day | ORAL | 0 refills | Status: AC

## 2017-02-18 MED ORDER — SPIRONOLACTONE 50 MG PO TABS: 50.0000 mg | ORAL_TABLET | Freq: Two times a day (BID) | ORAL | 0 refills | Status: AC

## 2017-02-18 MED ORDER — PENTOXIFYLLINE ER 400 MG PO TBCR: 400.0000 mg | EXTENDED_RELEASE_TABLET | Freq: Three times a day (TID) | ORAL | 0 refills | Status: AC

## 2017-02-18 MED ORDER — FOLIC ACID 1 MG PO TABS: 1.0000 mg | ORAL_TABLET | Freq: Every day | ORAL | Status: AC

## 2017-02-18 NOTE — Discharge Summary (Signed)
Physician Discharge Summary  Megan PerkingJody [name] WUJ:[id number] DOB: 01/15/1966 DOA: 02/11/2017  PCP: Patient, No Pcp Per  Admit date: 02/11/2017 Discharge date: 02/18/2017  Admitted From:  Disposition:   Recommendations for Outpatient Follow-up:  1. Follow up with PCP in 1-2 weeks 2. Please obtain BMP/CBC in one week Home Health none Equipment/Devices none Discharge Condition:stable CODE STATUS:full Diet recommendation:low salt  Brief/Interim Summary:::51 y.o.femalewith medical history significant of alcohol abuse, anxiety, hypertension, tobacco use disorder who is coming to the emergency department with complaints of not feeling well for the past 10 days, when she first began having nausea, decreased appetite, followed by jaundice, dark color urine, decreased urinary volume and abdominal distention for the past 2 days. She mentions that she has been drinking several shots of whiskey every night for the past 10 years. She denies drinking in the past 6 days, withdrawal symptoms or previous detox admission. She denies hepatitis history or exposure. She denies HIV exposure to her knowledge, and declined screening tests. No fever, but positive chills and fatigue. She denies headaches, earaches, sore throat, productive cough with dyspnea. However, she mentions having a recent nosebleed several days ago and bruising easily. No chest pain, dizziness, diaphoresis, orthopnea, PND or pitting edema of the lower extremities. Denies dysuria, frequency or hematuria, but states that her urinary volume has been less than usual. No polyuria, blurred vision, polyphagia or polydipsia.  ED Course:initial vital signs temperature 99.33F, pulse 103, respirations 16, blood pressure 133/82 mmHg and O2 sat 98% on room air. She received 1 L normal saline bolus in the emergency department.  Her workup shows a urine analysis showing mild glucosuria, small hemoglobinuria, moderate bilirubin and mild proteinuria. Microscopic  shows many bacteria. WBC was 23.8, hemoglobin 13.2 g/dL and platelets [id number]. PT 18.8 seconds and INR 1.59. Sodium 131, potassium 3.4, chloride 95 and bicarbonate 22 mmol/L. BUN  . BUN was 5, creatinine less than 0.30 and glucose 115 mg/dL.her albumin was 2.6 g/dL, total bilirubin 26 mg/dL, AST 213194 and alkaline phosphatase 304 units. She had a bm yesterday.Patient had paracentesis done 3.1 L of clear yellow fluid from her belly. She reports that she is feeling a lot better after the fluid was taken out. Appreciate GI input.fluid studies noted.  She was started on Lasix and Aldactone which see which she seems to have tolerated well. Discharge Diagnoses:  Principal Problem:   Acute alcoholic hepatitis Active Problems:   Hypertension   Hypokalemia   Heart murmur   Anxiety   Elevated MCV   Leukocytosis   Tobacco use Alcoholic hepatitis-it appears that her total bilirubin has plateaued.Paracentesis fluid does not reveal any evidence of infection.Will DC Cipro and Flagyl at this time.Mildly elevated ammonia patient refusing lactulose.For the last 2-3 days.Started on Aldactone 50 mg twice a day.Monitor liver function test daily.    Discharge Instructions   Allergies as of 02/18/2017      Reactions   Amoxicillin    Anaphylaxis, hives, throat swelling Has patient had a PCN reaction causing immediate rash, facial/tongue/throat swelling, SOB or lightheadedness with hypotension: Yes Has patient had a PCN reaction causing severe rash involving mucus membranes or skin necrosis: No Has patient had a PCN reaction that required hospitalization: No Has patient had a PCN reaction occurring within the last 10 years: Yes If all of the above answers are "NO", then may proceed with Cephalosporin use.   Macrobid [nitrofurantoin Monohyd Macro]    anaphylaxis   Sulfa Antibiotics    Anaphylaxis Pt reports tolerating Bactrim rx  from [facility] 08/2016      Medication List    STOP taking these  medications   amLODipine 5 MG tablet Commonly known as:  NORVASC   Cinnamon 500 MG capsule   HYDROcodone-acetaminophen 5-325 MG tablet Commonly known as:  NORCO/VICODIN   indomethacin 25 MG capsule Commonly known as:  INDOCIN   magnesium oxide 400 MG tablet Commonly known as:  MAG-OX   omeprazole 20 MG capsule Commonly known as:  PRILOSEC Replaced by:  pantoprazole 40 MG tablet   oxyCODONE-acetaminophen 5-325 MG tablet Commonly known as:  PERCOCET/ROXICET     TAKE these medications   ALPRAZolam 0.5 MG tablet Commonly known as:  XANAX Take 1 tablet (0.5 mg total) by mouth 2 (two) times daily as needed for anxiety.   folic acid 1 MG tablet Commonly known as:  FOLVITE Take 1 tablet (1 mg total) by mouth daily.   furosemide 40 MG tablet Commonly known as:  LASIX Take 1 tablet (40 mg total) by mouth daily.   pantoprazole 40 MG tablet Commonly known as:  PROTONIX Take 1 tablet (40 mg total) by mouth daily. Replaces:  omeprazole 20 MG capsule   pentoxifylline 400 MG CR tablet Commonly known as:  TRENTAL Take 1 tablet (400 mg total) by mouth 3 (three) times daily with meals.   spironolactone 50 MG tablet Commonly known as:  ALDACTONE Take 1 tablet (50 mg total) by mouth 2 (two) times daily.   thiamine 100 MG tablet Take 1 tablet (100 mg total) by mouth daily.   traMADol 50 MG tablet Commonly known as:  ULTRAM Take 1 tablet (50 mg total) by mouth every 6 (six) hours as needed for severe pain.   vitamin B-6 250 MG tablet Take 250 mg by mouth daily.       Allergies  Allergen Reactions  . Amoxicillin     Anaphylaxis, hives, throat swelling Has patient had a PCN reaction causing immediate rash, facial/tongue/throat swelling, SOB or lightheadedness with hypotension: Yes Has patient had a PCN reaction causing severe rash involving mucus membranes or skin necrosis: No Has patient had a PCN reaction that required hospitalization: No Has patient had a PCN reaction  occurring within the last 10 years: Yes If all of the above answers are "NO", then may proceed with Cephalosporin use.  [name] Macrobid [Nitrofurantoin Monohyd Macro]     anaphylaxis  . Sulfa Antibiotics     Anaphylaxis Pt reports tolerating Bactrim rx from [facility] 08/2016    Consultations:  Dr schooler gi   Procedures/Studies: Dg Chest 1 View  Result Date: 02/13/2017 CLINICAL DATA:  Feeling unwell for 10 days. Decreased appetite. Jaundice. History of alcohol abuse. EXAM: CHEST 1 VIEW COMPARISON:  None. FINDINGS: Cardiomediastinal silhouette is normal. Bronchitic changes. Strandy densities LEFT lung base with blunted LEFT costophrenic angle. No mediastinal shift or pneumothorax. Soft tissue planes and included osseous structures are nonsuspicious. IMPRESSION: Mild bronchitic changes. LEFT lung base atelectasis/ scarring with small LEFT pleural effusion. Electronically Signed   By: Awilda Metro M.D.   On: 02/13/2017 19:37   Dg Abd 1 View  Result Date: 02/13/2017 CLINICAL DATA:  Pain. EXAM: ABDOMEN - 1 VIEW COMPARISON:  CT abdomen pelvis dated February 11, 2017. FINDINGS: The bowel gas pattern is normal. No radio-opaque calculi or other significant radiographic abnormality are seen. IMPRESSION: Negative. Electronically Signed   By: Obie Dredge M.D.   On: 02/13/2017 19:36   Ct Abdomen Pelvis W Contrast  Result Date: 02/11/2017 CLINICAL DATA:  Nausea,  jaundice, abdominal swelling. Orange urine. Elevated bilirubin EXAM: CT ABDOMEN AND PELVIS WITH CONTRAST TECHNIQUE: Multidetector CT imaging of the abdomen and pelvis was performed using the standard protocol following bolus administration of intravenous contrast. CONTRAST:  ISOVUE-300 IOPAMIDOL (ISOVUE-300) INJECTION 61% COMPARISON:  CT 08/14/2005 FINDINGS: Lower chest: Lung bases are clear. Hepatobiliary: Moderately enlarged. There is ill-defined hypoattenuation within the LEFT and RIGHT hepatic lobe but more prominent in the RIGHT hepatic  lobe. No focal lesion is present. No biliary duct dilatation. The gallbladder is normal. Common bile duct is normal caliber. Pancreas: Pancreas normal. No pancreatic duct dilatation or inflammation. Spleen: Normal spleen Adrenals/urinary tract: Adrenal glands and kidneys are normal. The ureters and bladder normal. Stomach/Bowel: Stomach duodenum normal. There is a duodenum diverticulum extending along the anti mesenteric border of the second portion the duodenum (2.7 cm image 44, series 2). Colon and rectosigmoid colon normal. Post appendectomy. There multiple diverticula scattered throughout the colon without acute inflammation. There is moderate volume intraperitoneal free fluid along the margin liver and within the leaves of the small bowel and colon mesenteric. Vascular/Lymphatic: Abdominal aorta is normal caliber with atherosclerotic calcification. There is no retroperitoneal or periportal lymphadenopathy. No pelvic lymphadenopathy. Reproductive: Uterus and ovaries normal Other: Moderate free fluid in the abdomen pelvis is simple fluid attenuation Musculoskeletal: No aggressive osseous lesion. IMPRESSION: . 1. Liver is moderately enlarged and has diffuse hypoattenuation. 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 Infiltrative tumor could have a similar pattern. 2. Moderate volume of intraperitoneal free fluid presumed related to hepatic dysfunction. 3. Normal pancreas. 4. Incidental finding of duodenum diverticulum and colon diverticulosis. Electronically Signed   By: Genevive Bi M.D.   On: 02/11/2017 19:14   US Paracentesis  Result Date: 02/16/2017 INDICATION: Acute alcoholic hepatitis. Ascites. Request diagnostic and therapeutic paracentesis. EXAM: ULTRASOUND GUIDED LEFT LOWER QUADRANT PARACENTESIS MEDICATIONS: None. COMPLICATIONS: None immediate. PROCEDURE: Informed written consent was obtained from the  patient after a discussion of the risks, benefits and alternatives to treatment. A timeout was performed prior to the initiation of the procedure. Initial ultrasound scanning demonstrates a large amount of ascites within the left lower abdominal quadrant. The left lower abdomen was prepped and draped in the usual sterile fashion. 1% lidocaine with epinephrine was used for local anesthesia. Following this, a Safe-T-Centesis catheter was introduced. An ultrasound image was saved for documentation purposes. The paracentesis was performed. The catheter was removed and a dressing was applied. The patient tolerated the procedure well without immediate post procedural complication. FINDINGS: A total of approximately 3.1 L of clear, bright yellow fluid was removed. Samples were sent to the laboratory as requested by the clinical team. IMPRESSION: Successful ultrasound-guided paracentesis yielding 3.1 liters of peritoneal fluid. Read by: Brayton El PA-C Electronically Signed   By: Judie Petit.  Shick M.D.   On: 02/15/2017 16:26    (Echo, Carotid, EGD, Colonoscopy, ERCP)    Subjective:   Discharge Exam: Vitals:   02/17/17 2037 02/18/17 0506  BP: 110/72 (!) 91/53  Pulse: (!) 105 94  Resp: 16 16  Temp: 97.8 F (36.6 C) 97.6 F (36.4 C)  SpO2: 99% 96%   Vitals:   02/17/17 0440 02/17/17 1451 02/17/17 2037 02/18/17 0506  BP: 104/66 109/70 110/72 (!) 91/53  Pulse: 96 (!) 104 (!) 105 94  Resp: 20 18 16 16   Temp: 98.1 F (36.7 C) 98 F (36.7 C) 97.8 F (36.6 C) 97.6 F (36.4 C)  TempSrc: Oral Oral Oral Oral  SpO2: 95% 97% 99% 96%  Weight: 72.1 kg (159 lb)     Height:        General: Pt is alert, awake, not in acute distress Cardiovascular: RRR, S1/S2 +, no rubs, no gallops Respiratory: CTA bilaterally, no wheezing, no rhonchi Abdominal: Soft, NT, ND, bowel sounds + Extremities: no edema, no cyanosis    The results of significant diagnostics from this hospitalization (including imaging,  microbiology, ancillary and laboratory) are listed below for reference.     Microbiology: Recent Results (from the past 240 hour(s))  Culture, body fluid-bottle     Status: None (Preliminary result)   Collection Time: 02/15/17  4:27 PM  Result Value Ref Range Status   Specimen Description PLEURAL  Final   Special Requests NONE  Final   Culture   Final    NO GROWTH < 24 HOURS Performed at [facility], [address] [place], [place] [address]    Report Status PENDING  Incomplete  Gram stain     Status: None   Collection Time: 02/15/17  4:27 PM  Result Value Ref Range Status   Specimen Description PLEURAL  Final   Special Requests NONE  Final   Gram Stain   Final    RARE WBC PRESENT,BOTH PMN AND MONONUCLEAR NO ORGANISMS SEEN Performed at [facility], [address], [place], [place] [address]    Report Status 02/16/2017 FINAL  Final     Labs: BNP (last 3 results) No results for input(s): BNP in the last 8760 hours. Basic Metabolic Panel:  Recent Labs Lab 02/11/17 1550  02/14/17 0614 02/15/17 0643 02/15/17 1131 02/16/17 0623 02/17/17 0742  NA 131*  < > 133* 133* 134* 135 136  K 3.4*  < > 3.7 3.6 3.6 3.5 3.5  CL 95*  < > 102 101 102 105 106  CO2 22  < > 20* 17* 19* 19* 19*  GLUCOSE 115*  < > 99 109* 93 99 91  BUN 5*  < > 9 8 9 9 10   CREATININE <0.30*  < > <0.30* <0.30* <0.30* 0.33* 0.34*  CALCIUM 9.1  < > 9.0 8.7* 9.1 8.7* 9.0  MG 1.7  --   --   --   --   --   --   PHOS 3.3  --   --   --   --   --   --   < > = values in this interval not displayed. Liver Function Tests:  Recent Labs Lab 02/14/17 0614 02/15/17 0643 02/15/17 1131 02/16/17 0623 02/17/17 0742  AST 257* 197* 209* 183* 168*  ALT 63* 53 62* 54 53  ALKPHOS 288* 245* 261* 219* 244*  BILITOT 25.0* 24.5* 26.9* 24.2* 25.4*  PROT 7.1 6.4* 6.9 6.1* 6.1*  ALBUMIN 2.3* 2.4* 2.5* 2.1* 2.2*    Recent Labs Lab 02/11/17 1550 02/12/17 0653  LIPASE 59* 45    Recent Labs Lab  02/11/17 2016 02/16/17 0623  AMMONIA 35 58*   CBC:  Recent Labs Lab 02/11/17 1550 02/12/17 0653 02/13/17 0702 02/14/17 0614 02/15/17 0643 02/16/17 0623 02/17/17 0742  WBC 23.8* 22.3* 23.5* 24.9* 20.8* 21.6* 23.2*  NEUTROABS 21.5* 20.5* 19.7* 20.7*  --   --   --   HGB 13.2 12.2 10.8* 12.1 11.2* 10.8* 11.4*  HCT 36.7 33.7* 30.4* 33.7* 31.7* 29.9* 31.2*  MCV 115.4* 113.9* 116.0* 114.6* 114.9* 113.7* 112.6*  PLT 290 292 305 305 278 270  277   Cardiac Enzymes: No results for input(s): CKTOTAL, CKMB, CKMBINDEX, TROPONINI in the last 168 hours. BNP: Invalid input(s): POCBNP CBG: No results for input(s): GLUCAP in the last 168 hours. D-Dimer No results for input(s): DDIMER in the last 72 hours. Hgb A1c No results for input(s): HGBA1C in the last 72 hours. Lipid Profile No results for input(s): CHOL, HDL, LDLCALC, TRIG, CHOLHDL, LDLDIRECT in the last 72 hours. Thyroid function studies No results for input(s): TSH, T4TOTAL, T3FREE, THYROIDAB in the last 72 hours.  Invalid input(s): FREET3 Anemia work up No results for input(s): VITAMINB12, FOLATE, FERRITIN, TIBC, IRON, RETICCTPCT in the last 72 hours. Urinalysis    Component Value Date/Time   COLORURINE AMBER (A) 02/11/2017 1556   APPEARANCEUR CLOUDY (A) 02/11/2017 1556   LABSPEC 1.021 02/11/2017 1556   PHURINE 5.0 02/11/2017 1556   GLUCOSEU 50 (A) 02/11/2017 1556   HGBUR SMALL (A) 02/11/2017 1556   BILIRUBINUR MODERATE (A) 02/11/2017 1556   KETONESUR NEGATIVE 02/11/2017 1556   PROTEINUR 30 (A) 02/11/2017 1556   NITRITE NEGATIVE 02/11/2017 1556   LEUKOCYTESUR NEGATIVE 02/11/2017 1556   Sepsis Labs Invalid input(s): PROCALCITONIN,  WBC,  LACTICIDVEN Microbiology Recent Results (from the past 240 hour(s))  Culture, body fluid-bottle     Status: None (Preliminary result)   Collection Time: 02/15/17  4:27 PM  Result Value Ref Range Status   Specimen Description PLEURAL  Final   Special Requests NONE  Final   Culture    Final    NO GROWTH < 24 HOURS Performed at [facility], [address], [place], [place] [address]    Report Status PENDING  Incomplete  Gram stain     Status: None   Collection Time: 02/15/17  4:27 PM  Result Value Ref Range Status   Specimen Description PLEURAL  Final   Special Requests NONE  Final   Gram Stain   Final    RARE WBC PRESENT,BOTH PMN AND MONONUCLEAR NO ORGANISMS SEEN Performed at [facility], [address], [place], [place] [address]    Report Status 02/16/2017 FINAL  Final     Time coordinating discharge: Over 30 minutes  SIGNED:   Alwyn Ren, MD  [facility] 02/18/2017, 11:37 AM Pager   If 7PM-7AM, please contact night-coverage [email or web address] Password [email or web address]

## 2017-02-18 NOTE — Progress Notes (Signed)
Rn made aware by dietary representative that patient has not ordered breakfast or lunch. Rn confirmed with patient; patient stated she wanted to eat at home once discharged.

## 2017-02-18 NOTE — Progress Notes (Signed)
Megan [name] 9:06 AM  Subjective: Patient eating some and no new complaints and answers all questions appropriately and her case discussed with my partner and her hospital computer chart reviewed  Objective: Vital signs stable afebrile no acute distress some ascites nontender soft no new labs  Assessment: Alcoholic hepatitis  Plan: Patient actually stable to go home and let she goes to alcohol rehabilitation center and she should follow-up with my partners  Dr. Bosie ClosSchooler or Dr. Matthias Hughsbuccini who know her better and she would actually be better served being discharged on prednisolone 40 mg instead of Trental as long as no signs of infection based on her elevated Megan [name] maddrey  discriminatory function greater than 32 and he'll need to continue diuretics and have her avoid alcohol and aspirin nonsteroidals and Tylenol and probably follow-up in one week and please call us if we could be of any further assistance with this hospital stay  [facility] CenterMAGOD,Megan [name]  Pager [phone] After 5PM or if no answer call [phone]

## 2017-02-21 LAB — CULTURE, BODY FLUID-BOTTLE: CULTURE: NO GROWTH

## 2017-03-29 DEATH — deceased

## 2019-05-23 IMAGING — US US PARACENTESIS
1 series · 7 of 7 positions shown · non-contrast
Comparison: none

INDICATION: Acute alcoholic hepatitis. Ascites. Request diagnostic and
therapeutic paracentesis.

[Series 1: us paracentesis · 0.26mm/px · 7 of 7 slices shown]
[im 1/7]
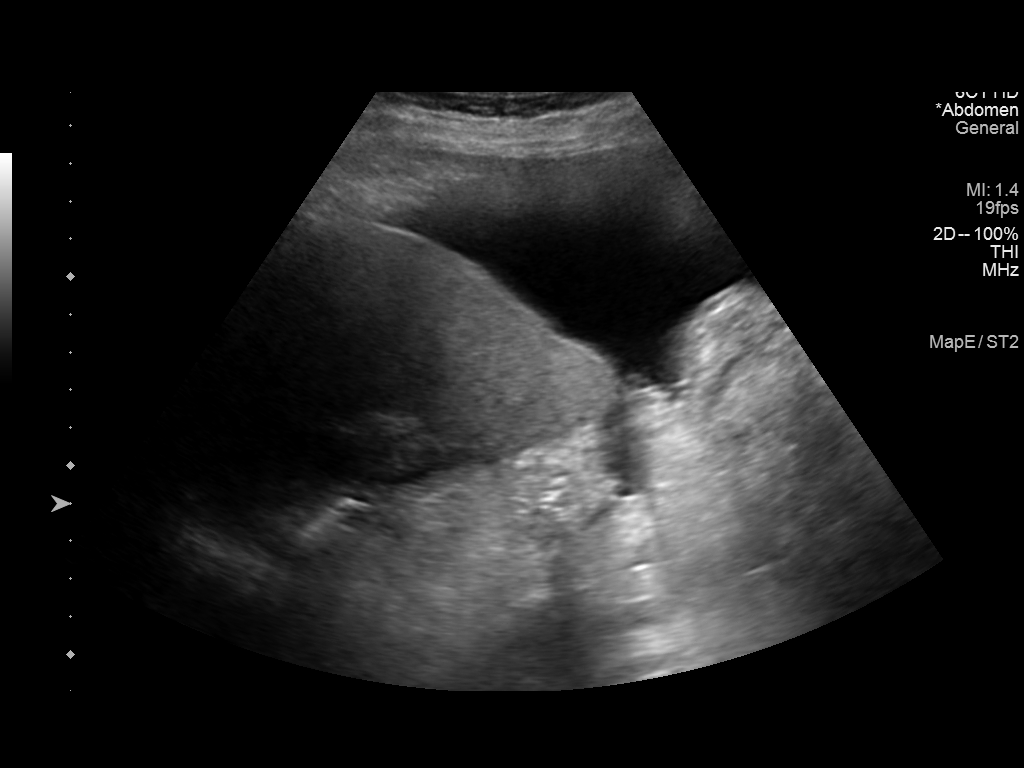
[im 2/7]
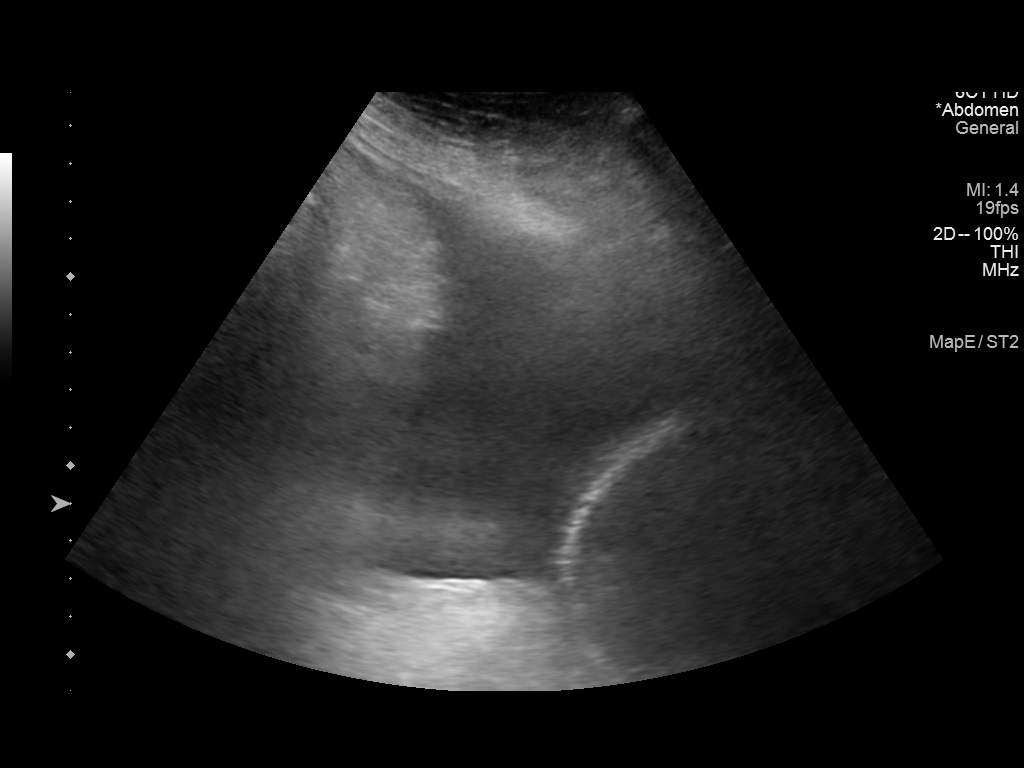
[im 3/7]
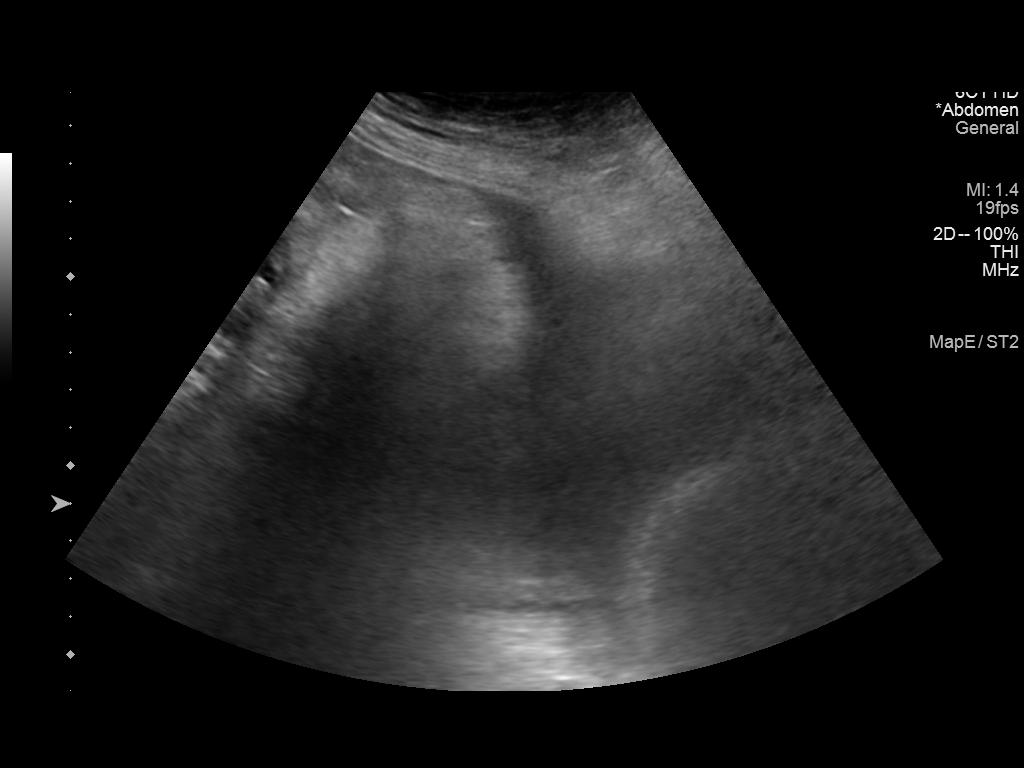
[im 4/7]
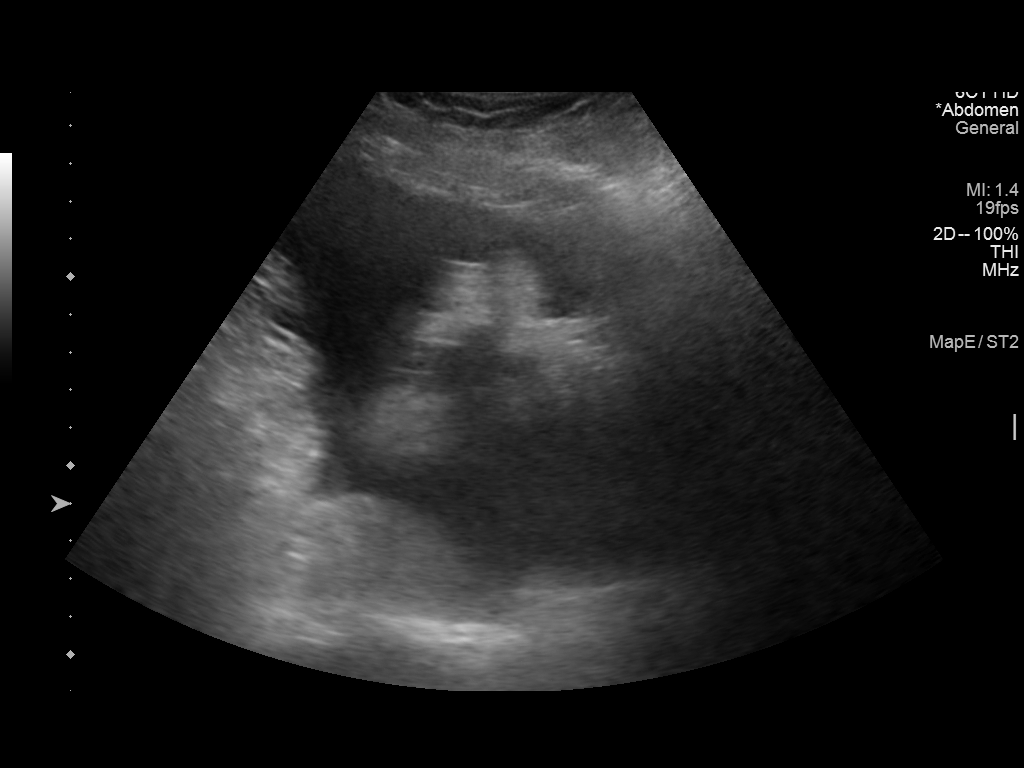
[im 5/7]
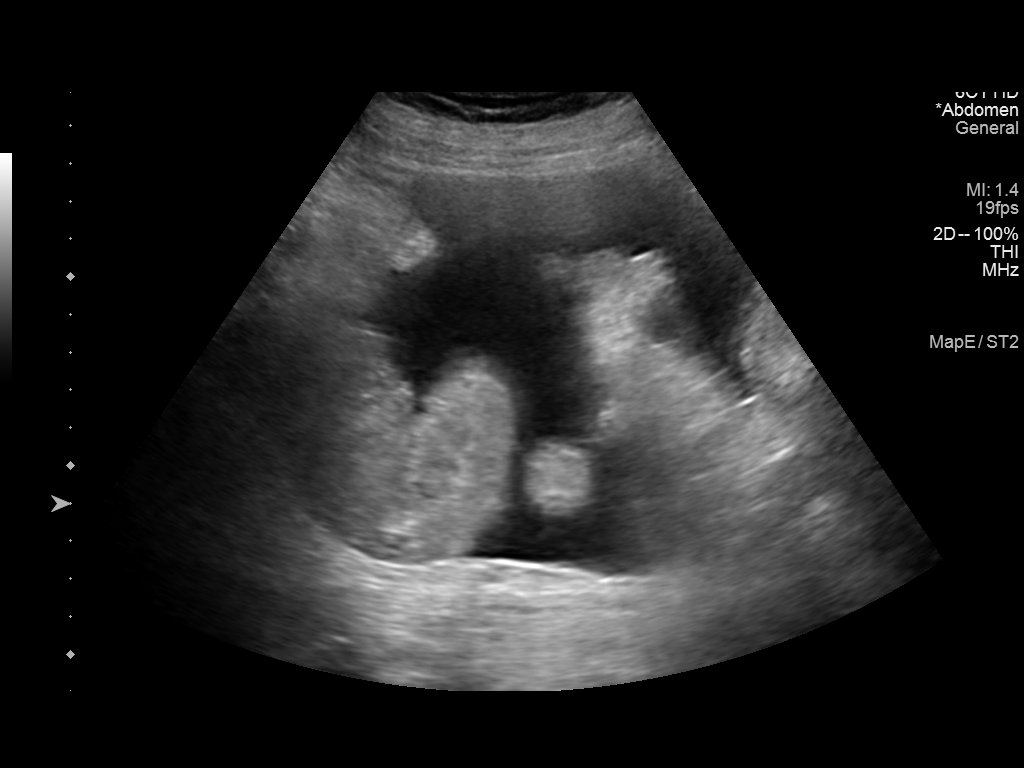
[im 6/7]
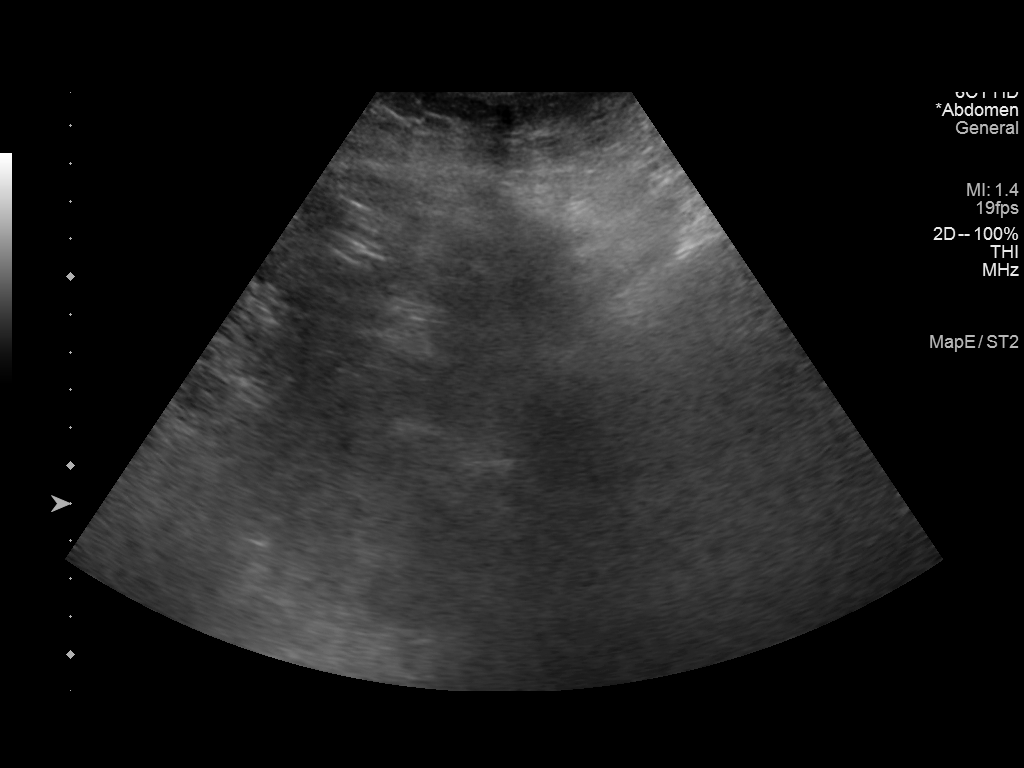
[im 7/7]
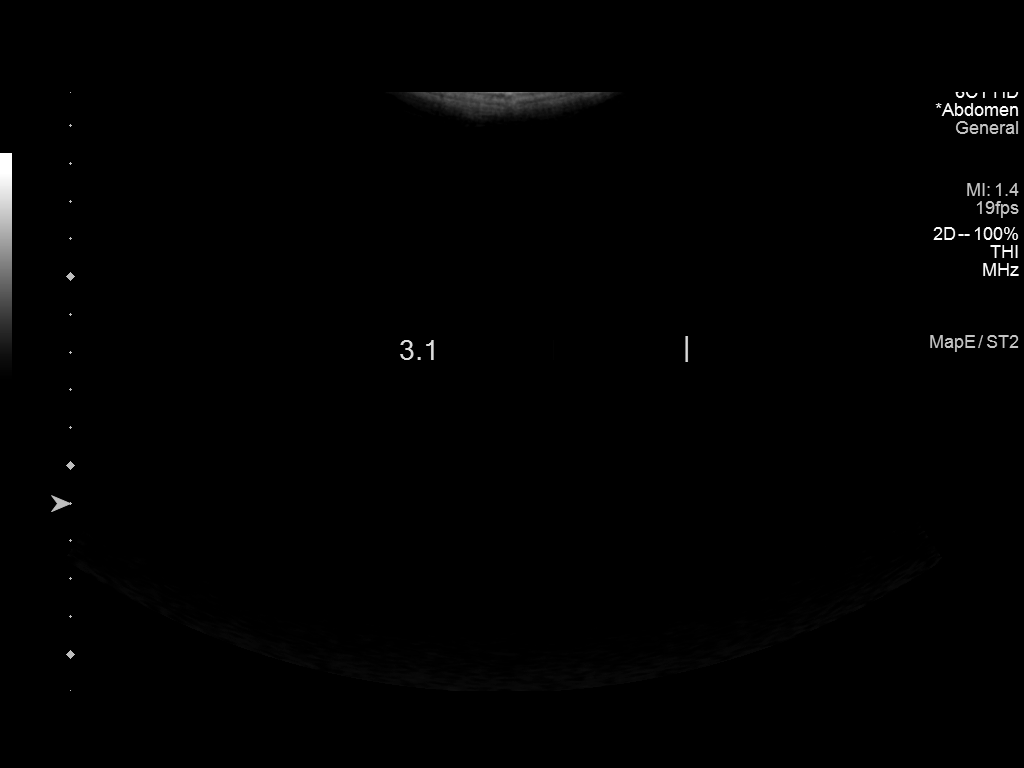

[7 of 7 positions shown; findings below may reference images not displayed]

EXAM:
ULTRASOUND GUIDED LEFT LOWER QUADRANT PARACENTESIS

MEDICATIONS:
None.

COMPLICATIONS:
None immediate.

PROCEDURE:
Informed written consent was obtained from the patient after a
discussion of the risks, benefits and alternatives to treatment. A
timeout was performed prior to the initiation of the procedure.

Initial ultrasound scanning demonstrates a large amount of ascites
within the left lower abdominal quadrant. The left lower abdomen was
prepped and draped in the usual sterile fashion. 1% lidocaine with
epinephrine was used for local anesthesia.

Following this, a Safe-T-Centesis catheter was introduced. An
ultrasound image was saved for documentation purposes. The
paracentesis was performed. The catheter was removed and a dressing
was applied. The patient tolerated the procedure well without
immediate post procedural complication.
FINDINGS: A total of approximately 3.1 L of clear, bright yellow fluid was
removed. Samples were sent to the laboratory as requested by the
clinical team.
IMPRESSION: Successful ultrasound-guided paracentesis yielding 3.1 liters of
peritoneal fluid.
